# Patient Record
Sex: Female | Born: 1960
Health system: Southern US, Community
[De-identification: ages and names within clinical notes are randomized; demographics above are authoritative.]

## PROBLEM LIST (undated history)

## (undated) DIAGNOSIS — C801 Malignant (primary) neoplasm, unspecified: Secondary | ICD-10-CM

## (undated) DIAGNOSIS — F32A Depression, unspecified: Secondary | ICD-10-CM

## (undated) DIAGNOSIS — R32 Unspecified urinary incontinence: Secondary | ICD-10-CM

## (undated) DIAGNOSIS — F419 Anxiety disorder, unspecified: Secondary | ICD-10-CM

## (undated) DIAGNOSIS — M1990 Unspecified osteoarthritis, unspecified site: Secondary | ICD-10-CM

## (undated) DIAGNOSIS — R011 Cardiac murmur, unspecified: Secondary | ICD-10-CM

## (undated) DIAGNOSIS — T8859XA Other complications of anesthesia, initial encounter: Secondary | ICD-10-CM

## (undated) DIAGNOSIS — D126 Benign neoplasm of colon, unspecified: Secondary | ICD-10-CM

## (undated) DIAGNOSIS — I10 Essential (primary) hypertension: Secondary | ICD-10-CM

## (undated) DIAGNOSIS — K219 Gastro-esophageal reflux disease without esophagitis: Secondary | ICD-10-CM

## (undated) DIAGNOSIS — L309 Dermatitis, unspecified: Secondary | ICD-10-CM

## (undated) DIAGNOSIS — D649 Anemia, unspecified: Secondary | ICD-10-CM

## (undated) DIAGNOSIS — D494 Neoplasm of unspecified behavior of bladder: Secondary | ICD-10-CM

## (undated) DIAGNOSIS — F329 Major depressive disorder, single episode, unspecified: Secondary | ICD-10-CM

## (undated) DIAGNOSIS — E785 Hyperlipidemia, unspecified: Secondary | ICD-10-CM

## (undated) DIAGNOSIS — T4145XA Adverse effect of unspecified anesthetic, initial encounter: Secondary | ICD-10-CM

## (undated) HISTORY — DX: Depression, unspecified: F32.A

## (undated) HISTORY — DX: Major depressive disorder, single episode, unspecified: F32.9

## (undated) HISTORY — DX: Unspecified urinary incontinence: R32

## (undated) HISTORY — DX: Benign neoplasm of colon, unspecified: D12.6

## (undated) HISTORY — DX: Anemia, unspecified: D64.9

## (undated) HISTORY — PX: TUBAL LIGATION: SHX77

## (undated) HISTORY — PX: WISDOM TOOTH EXTRACTION: SHX21

## (undated) HISTORY — DX: Hyperlipidemia, unspecified: E78.5

## (undated) HISTORY — DX: Cardiac murmur, unspecified: R01.1

## (undated) HISTORY — DX: Anxiety disorder, unspecified: F41.9

---
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
## 2000-07-03 ENCOUNTER — Emergency Department (HOSPITAL_COMMUNITY): Admission: EM | Admit: 2000-07-03 | Discharge: 2000-07-03 | Payer: Self-pay | Admitting: Emergency Medicine

## 2010-05-15 ENCOUNTER — Ambulatory Visit
Admission: RE | Admit: 2010-05-15 | Discharge: 2010-05-15 | Payer: Self-pay | Source: Home / Self Care | Attending: Family Medicine | Admitting: Family Medicine

## 2010-05-15 DIAGNOSIS — L851 Acquired keratosis [keratoderma] palmaris et plantaris: Secondary | ICD-10-CM | POA: Insufficient documentation

## 2010-05-15 DIAGNOSIS — N959 Unspecified menopausal and perimenopausal disorder: Secondary | ICD-10-CM | POA: Insufficient documentation

## 2010-05-15 DIAGNOSIS — N39498 Other specified urinary incontinence: Secondary | ICD-10-CM | POA: Insufficient documentation

## 2010-05-29 ENCOUNTER — Encounter (INDEPENDENT_AMBULATORY_CARE_PROVIDER_SITE_OTHER): Payer: Self-pay | Admitting: *Deleted

## 2010-06-07 ENCOUNTER — Other Ambulatory Visit: Payer: Self-pay | Admitting: Family Medicine

## 2010-06-07 DIAGNOSIS — Z1231 Encounter for screening mammogram for malignant neoplasm of breast: Secondary | ICD-10-CM

## 2010-06-07 DIAGNOSIS — Z1239 Encounter for other screening for malignant neoplasm of breast: Secondary | ICD-10-CM

## 2010-06-12 ENCOUNTER — Ambulatory Visit: Admit: 2010-06-12 | Payer: Self-pay | Admitting: Gastroenterology

## 2010-06-12 ENCOUNTER — Encounter (INDEPENDENT_AMBULATORY_CARE_PROVIDER_SITE_OTHER): Payer: Self-pay | Admitting: *Deleted

## 2010-06-13 NOTE — Letter (Signed)
Summary: Pre Visit Letter Revised  [facility]  [address] [place], [place] [address]   Phone: [phone]  Fax: [phone]        05/29/2010 MRN: [id number] [name] [address] [place], [place]  [address]             Procedure Date:  June 26, 2010   Welcome to the Gastroenterology Division at [facility].    You are scheduled to see a nurse for your pre-procedure visit on June 12, 2010 at 8:30am  on the 3rd floor at [facility], [address]  We ask that you try to arrive at our office 15 minutes prior to your appointment time to allow for check-in.  Please take a minute to review the attached form.  If you answer "Yes" to one or more of the questions on the first page, we ask that you call the person listed at your earliest opportunity.  If you answer "No" to all of the questions, please complete the rest of the form and bring it to your appointment.    Your nurse visit will consist of discussing your medical and surgical history, your immediate family medical history, and your medications.   If you are unable to list all of your medications on the form, please bring the medication bottles to your appointment and we will list them.  We will need to be aware of both prescribed and over the counter drugs.  We will need to know exact dosage information as well.    Please be prepared to read and sign documents such as consent forms, a financial agreement, and acknowledgement forms.  If necessary, and with your consent, a friend or relative is welcome to sit-in on the nurse visit with you.  Please bring your insurance card so that we may make a copy of it.  If your insurance requires a referral to see a specialist, please bring your referral form from your primary care physician.  No co-pay is required for this nurse visit.     If you cannot keep your appointment, please call [phone] to cancel or reschedule prior to your appointment date.   This allows [place] the opportunity to schedule an appointment for another patient in need of care.    Thank you for choosing [facility] for your medical needs.  We appreciate the opportunity to care for you.  Please visit [place] at our website  to learn more about our practice.  Sincerely, The Gastroenterology Division

## 2010-06-13 NOTE — Assessment & Plan Note (Signed)
Summary: new to estab/cbs   Vital Signs:  Patient profile:   50 year old female Height:      65 inches Weight:      149 pounds BMI:     24.88 Pulse rate:   64 / minute BP sitting:   112 / 68  (left arm)  Vitals Entered By: Doristine Devoid CMA (May 15, 2010 3:14 PM) CC: NEW EST- spot on L leg, bladder issue w/ coughing or sneezing,   History of Present Illness: 50 yo woman here today to establish care.  previous MD- none.  GYN- none.  perimenopausal- having irregular periods.  not having hot flashes.  did have a stretch of fatigue but this has improved.  family hx of cancer- mom w/ ovarian, colon and possibly breast cancer.  pt not UTD on mammogram, pap, and has never had colonoscopy.  spot on L leg- has been watching spot 'for a long time'.  pt feels it is now irregularly shaped.  hx of sun exposure  stress incontinence- sxs started 1 year ago.  occurs w/ coughing or sneezing.  not occuring w/ laughter or exercise.  doesn't do Kegel exercises.  Preventive Screening-Counseling & Management  Alcohol-Tobacco     Alcohol drinks/day: <1     Smoking Status: never      Sexual History:  currently monogamous.        Drug Use:  never.    Current Medications (verified): 1)  None  Allergies (verified): No Known Drug Allergies  Past History:  Past Medical History: Hear murmur  Past Surgical History: BTL  Family History: CAD-father HTN-father DM-mother STROKE-no COLON CA-mother BREAST CA-mother? OVARIAN CA-maternal grandmother  Social History: lives alone 2 children- son local, daughter in [place] works for [employer] in [employer] Status:  never Sexual History:  currently monogamous Drug Use:  never  Review of Systems General:  Denies chills, fatigue, fever, loss of appetite, malaise, sleep disorder, and sweats. Eyes:  Denies blurring and double vision. ENT:  Denies nasal congestion and postnasal drainage. CV:  Denies chest pain or  discomfort, fatigue, shortness of breath with exertion, swelling of feet, and swelling of hands. Resp:  Denies cough and shortness of breath. GI:  Denies abdominal pain, change in bowel habits, nausea, and vomiting. MS:  Denies joint pain. Derm:  Complains of lesion(s); denies changes in color of skin, dryness, and itching. Neuro:  Denies headaches. Psych:  Denies anxiety and depression.  Physical Exam  General:  Well-developed,well-nourished,in no acute distress; alert,appropriate and cooperative throughout examination Head:  Normocephalic and atraumatic without obvious abnormalities. No apparent alopecia or balding. Eyes:  PERRL, EOMI Neck:  No deformities, masses, or tenderness noted. Lungs:  Normal respiratory effort, chest expands symmetrically. Lungs are clear to auscultation, no crackles or wheezes. Heart:  Normal rate and regular rhythm. S1 and S2 normal without gallop, murmur, click, rub or other extra sounds. Abdomen:  soft, NT/ND, +BS Pulses:  +2 carotid, radial, DP Extremities:  no C/C/E Neurologic:  alert & oriented X3, cranial nerves II-XII intact, gait normal, and DTRs symmetrical and normal.   Skin:  keratosis on L leg Cervical Nodes:  No lymphadenopathy noted Psych:  pressured speech, somewhat difficult to follow thought process   Impression & Recommendations:  Problem # 1:  PERIMENOPAUSAL SYNDROME (ICD-627.9) Assessment New pt reports irregular periods but denies other sxs at this time- no mood swings, hot flashes, fatigue.  will follow.  Problem # 2:  FAMILY HISTORY OF MALIGNANT NEOPLASM OVARY (ICD-V16.41)  Assessment: New stressed importance of regular GYN screening and mammogram.  pt to schedule.  Problem # 3:  KERATOSIS (ICD-701.1) Assessment: New no need for derm referral at this time.  will follow.  Problem # 4:  STRESS INCONTINENCE (ICD-788.39) Assessment: New encouraged pt to start kegel exercises.  if sxs persist will refer to urology.  Pt expresses  understanding and is in agreement w/ this plan.  Other Orders: Gastroenterology Referral (GI) Radiology Referral (Radiology)  Patient Instructions: 1)  Please schedule your physical as soon as possible- do not eat before this appt 2)  Someone will call you with your mammogram and GI (colonoscopy) appt 3)  Start doing your Kegel exercises at least 3x/day to strengthen your bladder muscles 4)  Call with any questions or concerns 5)  Welcome!  We're glad to have you!   Orders Added: 1)  Gastroenterology Referral [GI] 2)  Radiology Referral [Radiology] 3)  New Patient Level III [phone]

## 2010-06-19 NOTE — Letter (Signed)
Summary: Pre Visit No Show Letter  [facility]  [address] [place], [place] [address]   Phone: [phone]  Fax: [phone]        June 12, 2010 MRN: [id number]    [name] [address] [place], [place]  [address]    Dear Ms. STROUD,   We have been unable to reach you by phone concerning the pre-procedure visit that you missed on Wednesday 06/12/2010. For this reason,your procedure scheduled on Friday 05/18/2010 has been cancelled. Our scheduling staff will gladly assist you with rescheduling your appointments at a more convenient time. Please call our office at [phone] between the hours of 8:00am and 5:00pm, press option #2 to reach an appointment scheduler. Please consider updating your contact numbers at this time so that we can reach you by phone in the future with schedule changes or results.    Thank you,    Ezra Sites RN [facility]

## 2010-06-26 ENCOUNTER — Other Ambulatory Visit: Payer: Self-pay | Admitting: Gastroenterology

## 2010-07-03 ENCOUNTER — Ambulatory Visit: Payer: BC Managed Care – PPO

## 2010-07-05 ENCOUNTER — Other Ambulatory Visit (HOSPITAL_COMMUNITY)
Admission: RE | Admit: 2010-07-05 | Discharge: 2010-07-05 | Disposition: A | Discharge status: Home / Self Care | Payer: BC Managed Care – PPO | Source: Ambulatory Visit | Attending: Family Medicine | Admitting: Family Medicine

## 2010-07-05 ENCOUNTER — Encounter: Payer: Self-pay | Admitting: Family Medicine

## 2010-07-05 ENCOUNTER — Other Ambulatory Visit: Payer: Self-pay | Admitting: Family Medicine

## 2010-07-05 ENCOUNTER — Encounter (INDEPENDENT_AMBULATORY_CARE_PROVIDER_SITE_OTHER): Payer: BC Managed Care – PPO | Admitting: Family Medicine

## 2010-07-05 DIAGNOSIS — F411 Generalized anxiety disorder: Secondary | ICD-10-CM

## 2010-07-05 DIAGNOSIS — Z01419 Encounter for gynecological examination (general) (routine) without abnormal findings: Secondary | ICD-10-CM | POA: Insufficient documentation

## 2010-07-05 DIAGNOSIS — Z0000 Encounter for general adult medical examination without abnormal findings: Secondary | ICD-10-CM

## 2010-07-05 DIAGNOSIS — F418 Other specified anxiety disorders: Secondary | ICD-10-CM | POA: Insufficient documentation

## 2010-07-09 ENCOUNTER — Other Ambulatory Visit: Payer: BC Managed Care – PPO

## 2010-07-16 ENCOUNTER — Encounter: Payer: Self-pay | Admitting: Family Medicine

## 2010-07-16 ENCOUNTER — Other Ambulatory Visit (INDEPENDENT_AMBULATORY_CARE_PROVIDER_SITE_OTHER): Payer: BC Managed Care – PPO

## 2010-07-16 ENCOUNTER — Encounter (INDEPENDENT_AMBULATORY_CARE_PROVIDER_SITE_OTHER): Payer: Self-pay | Admitting: *Deleted

## 2010-07-16 ENCOUNTER — Other Ambulatory Visit: Payer: Self-pay | Admitting: Family Medicine

## 2010-07-16 DIAGNOSIS — Z0000 Encounter for general adult medical examination without abnormal findings: Secondary | ICD-10-CM

## 2010-07-16 DIAGNOSIS — E785 Hyperlipidemia, unspecified: Secondary | ICD-10-CM

## 2010-07-16 LAB — LIPID PANEL
Cholesterol: 160 mg/dL (ref 0–200)
Total CHOL/HDL Ratio: 4
Triglycerides: 303 mg/dL — ABNORMAL HIGH (ref 0.0–149.0)

## 2010-07-16 LAB — HEPATIC FUNCTION PANEL
ALT: 21 U/L (ref 0–35)
AST: 23 U/L (ref 0–37)
Bilirubin, Direct: 0.1 mg/dL (ref 0.0–0.3)
Total Bilirubin: 0.1 mg/dL — ABNORMAL LOW (ref 0.3–1.2)
Total Protein: 6.2 g/dL (ref 6.0–8.3)

## 2010-07-16 LAB — CBC WITH DIFFERENTIAL/PLATELET
Basophils Relative: 0.9 % (ref 0.0–3.0)
HCT: 33.2 % — ABNORMAL LOW (ref 36.0–46.0)
Hemoglobin: 11 g/dL — ABNORMAL LOW (ref 12.0–15.0)
Lymphocytes Relative: 32.5 % (ref 12.0–46.0)
Lymphs Abs: 1.6 10*3/uL (ref 0.7–4.0)
Monocytes Relative: 10.9 % (ref 3.0–12.0)
Neutro Abs: 2.3 10*3/uL (ref 1.4–7.7)
RBC: 4.11 Mil/uL (ref 3.87–5.11)

## 2010-07-16 LAB — BASIC METABOLIC PANEL
BUN: 13 mg/dL (ref 6–23)
CO2: 25 mEq/L (ref 19–32)
Chloride: 110 mEq/L (ref 96–112)
Creatinine, Ser: 0.7 mg/dL (ref 0.4–1.2)
Potassium: 4.3 mEq/L (ref 3.5–5.1)

## 2010-07-16 LAB — TSH: TSH: 1.13 u[IU]/mL (ref 0.35–5.50)
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
## 2010-07-22 ENCOUNTER — Telehealth: Payer: Self-pay | Admitting: *Deleted

## 2010-07-27 ENCOUNTER — Encounter: Payer: Self-pay | Admitting: Family Medicine

## 2010-07-30 NOTE — Progress Notes (Signed)
Summary: rx & other questions     Phone Note Call from Patient Call back at Home Phone [phone]   Caller: Patient Summary of Call: patient received lab result trigs were high - patient is to start fenofibrate - she didnt receive rx nor is it added to med list - [facility] [place]    - she also has question about bladder leakage that she mentioned in visit - has question about spot on leg  Initial call taken by: Okey Regal [place],  July 22, 2010 5:08 PM  Follow-up for Phone Call        Left message on machine to call back to office. Lucious Groves CMA  July 23, 2010 8:47 AM   No return call, left message to call back to office. Lucious Groves CMA  July 24, 2010 4:12 PM   Patient notified. She is worried about the spot on her leg and would like referral to dermatology. She would like to know what to do about bladder leakage? She also notes toe pain and is worried she may have gout. Please advise.  Lucious Groves CMA  July 25, 2010 4:37 PM   Additional Follow-up for Phone Call Additional follow up Details #1::        can have referral to dermatology although w/ her insurance she doesn't need one and is able to call whomever she would like.  if she doesn't have anyone in mind we will refer.  in regads to toe pain and bladder leakage- those require a visit and cannot be addressed over the phone Additional Follow-up by: Neena Rhymes MD,  July 26, 2010 8:31 AM    Additional Follow-up for Phone Call Additional follow up Details #2::    Left message on machine to call back to office. Lucious Groves CMA  July 26, 2010 10:24 AM   Additional Follow-up for Phone Call Additional follow up Details #3:: Details for Additional Follow-up Action Taken: Discuss with patient, appt scheduled....Marland KitchenMarland KitchenFelecia Deloach CMA  July 26, 2010 5:10 PM   New/Updated Medications: FENOFIBRATE 160 MG TABS (FENOFIBRATE) 1 by mouth once daily Prescriptions: FENOFIBRATE 160 MG TABS (FENOFIBRATE) 1 by mouth once  daily  #30 x 3   Entered by:   Lucious Groves CMA   Authorized by:   Neena Rhymes MD   Signed by:   Lucious Groves CMA on 07/23/2010   Method used:   Electronically to        [facility]* (retail)       [address]       [place], [place]  [address]       Ph: [phone]       Fax: [phone]   RxID:   [id number]

## 2010-08-08 ENCOUNTER — Ambulatory Visit: Payer: BC Managed Care – PPO | Admitting: Family Medicine

## 2010-08-12 ENCOUNTER — Ambulatory Visit: Payer: BC Managed Care – PPO | Admitting: Family Medicine

## 2010-10-29 ENCOUNTER — Other Ambulatory Visit: Payer: Self-pay | Admitting: Family Medicine

## 2010-10-30 MED ORDER — ALPRAZOLAM 0.25 MG PO TABS: 0.2500 mg | ORAL_TABLET | Freq: Every evening | ORAL | Status: DC | PRN

## 2010-10-30 NOTE — Telephone Encounter (Signed)
Left message on voicemail to call the office

## 2010-10-30 NOTE — Telephone Encounter (Signed)
Ok for #30.  Please remind her that she needs to return for LFTs since she started Fenofibrate.

## 2010-10-30 NOTE — Telephone Encounter (Signed)
Please advise, Also, fyi, pt never had lft's checked after starting Fenofibrate.

## 2010-10-31 NOTE — Telephone Encounter (Signed)
Received vm from the patient stating that she did pick up the med but she is fine when sleeping. Pt notes that she needs something for day. Do you want her taking the Xanax at night or day? If so, how many times per day? Left message on voicemail to call the office.

## 2010-10-31 NOTE — Telephone Encounter (Signed)
Left message on voicemail to call the office

## 2010-10-31 NOTE — Telephone Encounter (Signed)
Pt notified. Pt also notes that she has not been on the Fenofibrate and that is why she did not come back for the labs. Pt would like to take medication for her Triglycerides, but notes that she does not want to take Fenofibrate due to the side effects. She states that she does not want to take a medication that is going to make her worse. I advised her that Fenofibrate is the generic med that specifically targets triglycerides. Pt notes that maybe she will have to try a more expensive med that doesn't have as many side effects. She notes that she did not understand her labs. I advised pt that she needs ov to discuss the questions she has about her labs and to discuss medication options. Pt expressed understanding and states that she has "too much on me right now" and cannot come in for ov due to co-pay. I advised the patient that I would let the doctor know that she will call back for ov when she can afford it.

## 2010-10-31 NOTE — Telephone Encounter (Signed)
If she cannot afford the OV, a more expensive med seems like a bad idea.  Even the brand name triglyceride meds are fenofibrate and would have the same potential side effects.  It is more risky to have thick, fatty blood than to take the medicine.  Will need to follow up when she is able b/c it sounds as if we have a lot to discuss.

## 2010-10-31 NOTE — Telephone Encounter (Signed)
She can take meds during the day but it will make her sleepy

## 2010-10-31 NOTE — Telephone Encounter (Signed)
Left 2nd message on fathers number for pt to call the office. Still unable to reach the pt at her number.

## 2010-11-01 NOTE — Telephone Encounter (Signed)
Left message on voicemail to call the office

## 2010-11-04 NOTE — Telephone Encounter (Signed)
Left message on voicemail to call the office

## 2010-11-05 NOTE — Telephone Encounter (Signed)
No return call from pt, sent letter.

## 2011-06-10 ENCOUNTER — Ambulatory Visit: Payer: BC Managed Care – PPO

## 2011-06-30 ENCOUNTER — Ambulatory Visit
Admission: RE | Admit: 2011-06-30 | Discharge: 2011-06-30 | Disposition: A | Discharge status: Home / Self Care | Payer: BC Managed Care – PPO | Source: Ambulatory Visit | Attending: Family Medicine | Admitting: Family Medicine

## 2011-06-30 DIAGNOSIS — Z1231 Encounter for screening mammogram for malignant neoplasm of breast: Secondary | ICD-10-CM

## 2011-07-03 ENCOUNTER — Other Ambulatory Visit: Payer: Self-pay | Admitting: Family Medicine

## 2011-07-03 DIAGNOSIS — R928 Other abnormal and inconclusive findings on diagnostic imaging of breast: Secondary | ICD-10-CM

## 2011-07-23 ENCOUNTER — Other Ambulatory Visit: Payer: BC Managed Care – PPO

## 2011-08-08 ENCOUNTER — Other Ambulatory Visit: Payer: Self-pay | Admitting: Family Medicine

## 2011-08-08 ENCOUNTER — Ambulatory Visit
Admission: RE | Admit: 2011-08-08 | Discharge: 2011-08-08 | Disposition: A | Discharge status: Home / Self Care | Payer: BC Managed Care – PPO | Source: Ambulatory Visit | Attending: Family Medicine | Admitting: Family Medicine

## 2011-08-08 DIAGNOSIS — R928 Other abnormal and inconclusive findings on diagnostic imaging of breast: Secondary | ICD-10-CM

## 2011-08-11 ENCOUNTER — Inpatient Hospital Stay: Admission: RE | Admit: 2011-08-11 | Payer: BC Managed Care – PPO | Source: Ambulatory Visit

## 2011-08-26 ENCOUNTER — Telehealth: Payer: Self-pay | Admitting: Family Medicine

## 2011-08-26 NOTE — Telephone Encounter (Signed)
Caller: [name]/Patient; PCP: Sheliah Hatch.; CB#: [phone];  Call regarding Leaking Bladder; Onset approx. 1 year, seems to be getting worse - occurs with coughing or sneezing only.  Occasional "bladder irritation"  lasting for a few minutes.  Emergent sx ruled out.  Home care for the interim and parameters for callback given.  See Provider within 2 Weeks per Urinary Sx. protocol.  Caller states plan to call for appt at a later time.

## 2011-08-27 NOTE — Telephone Encounter (Signed)
Agree w/ appt- not urgent

## 2011-09-01 NOTE — Telephone Encounter (Signed)
Called pt to get an update per did not note an apt made, left vm to see if pt sxs were better or if she still needs to schedule apt

## 2011-10-03 ENCOUNTER — Encounter: Payer: Self-pay | Admitting: Internal Medicine

## 2011-10-28 ENCOUNTER — Ambulatory Visit (AMBULATORY_SURGERY_CENTER): Payer: BC Managed Care – PPO

## 2011-10-28 VITALS — Ht 64.5 in | Wt 141.9 lb

## 2011-10-28 DIAGNOSIS — Z1211 Encounter for screening for malignant neoplasm of colon: Secondary | ICD-10-CM

## 2011-10-28 NOTE — Progress Notes (Signed)
Pt came into office for her pre-visit prior to her colonoscopy with Dr.  Rhea Belton on 11/11/11. Pt is requesting an endoscopy to be done with the  colonoscopy due to reflux. She will discuss this with Dr Beverely Low and have their office call back to reschedule an endo/colon.Her appt on 11/11/11 for her colonoscopy was cancelled. Pt understood. Ulis Rias RN

## 2011-10-29 ENCOUNTER — Telehealth: Payer: Self-pay | Admitting: *Deleted

## 2011-10-29 NOTE — Telephone Encounter (Signed)
Pt called to advise that during her GI apt with MD Pyrtle office she was advised that MD Beverely Low will need to order the upper endoscopy for her, advised pt that MD Tabori protocol is to send the pt for the GI referral and GI MD will advise if she needs a upper endoscopy along with the colonoscopy that MD Tabori sent the pt to GI for originally, called Pyrtle nurse leslie and advised pt testing request, was advised the pt came in for a procedure discussion visit with the nurse yesterday per notes MD Pyrtle is out of the office all week, pt did have the procedure scheduled for July 2nd and leslie notes the nurse that spoke with the pt during the OV cancelled the procedure for the pt yesterday, leslie noted she will contact the nurse from the OV yesterday and get an update as to why the pt cancelled and what was advised to the pt, gave personal extension

## 2011-10-30 NOTE — Telephone Encounter (Signed)
Verlon Au called back to advise that after research this pt was indeed advised to call MD Tabori about adding a EGD with the colonoscopy, however this is not protocol that she was told this, she notes the CMA that told the pt this is not in the office today and the pt may have to come back in to discuss directly with MD Pyrtle, leslie will call back again tomorrow to update, advised pt that the GI office will contact her about the EGD, pt stated that she told the nurse that she wanted to ask MD Tabori about ordering the test, advised pt that the GI MD will need to order her tests per a specialist, pt asked about coming into the office per notes still bleeding a small amount after her period stopped a week ago, advised she can come in for an OV however MD Beverely Low advises that this can be common for her age and may continue throughout the menopausel period, however no way to note when this period will be over, pt advised she can also monitor her sxs and see if they improve, noted last PAP on 07-05-10 pt does not have a GYN, pt decided she will just see if her sxs improve, pt understood all instructions

## 2011-10-31 ENCOUNTER — Telehealth: Payer: Self-pay | Admitting: Internal Medicine

## 2011-10-31 NOTE — Telephone Encounter (Signed)
LVM for pt to call me back regarding colon and possible endo.

## 2011-10-31 NOTE — Telephone Encounter (Signed)
Ulis Rias, RN 10/28/2011 5:29 PM Signed  Pt came into office for her pre-visit prior to her colonoscopy with Dr. Rhea Belton on 11/11/11. Pt is requesting an endoscopy to be done with the colonoscopy due to reflux. She will discuss this with Dr Beverely Low and have their office call back to reschedule an endo/colon.Her appt on 11/11/11 for her colonoscopy was cancelled. Pt understood. Ulis Rias RN   Would like pt to come in and speak to you about possible endo/colon.  Please advise.

## 2011-10-31 NOTE — Telephone Encounter (Signed)
Not sure if you want pt to come in and discuss Endo.  Please advise.  Ulis Rias, RN 10/28/2011 5:29 PM Signed  Pt came into office for her pre-visit prior to her colonoscopy with Dr. Rhea Belton on 11/11/11. Pt is requesting an endoscopy to be done with the colonoscopy due to reflux. She will discuss this with Dr Beverely Low and have their office call back to reschedule an endo/colon.Her appt on 11/11/11 for her colonoscopy was cancelled. Pt understood. Ulis Rias RN

## 2011-10-31 NOTE — Telephone Encounter (Deleted)
Patient scheduled for next week to discuss.

## 2011-11-03 ENCOUNTER — Telehealth: Payer: Self-pay | Admitting: Family Medicine

## 2011-11-03 NOTE — Telephone Encounter (Signed)
Called pt to advise that we still have not heard from the GI office, pt stated she was returning my call from Friday, noted in chart the pt actually received a call from GI and not this office, advised pt to call the GI MD about the call, pt understood

## 2011-11-05 ENCOUNTER — Telehealth: Payer: Self-pay | Admitting: Gastroenterology

## 2011-11-05 NOTE — Telephone Encounter (Signed)
lvm for pt to call back to discuss Colon and endo

## 2011-11-07 NOTE — Telephone Encounter (Signed)
Pt called this office to return our call advised that we did not call her Gastro office called about her apt for endo, pt stated she will call them to clarify, pt noted confused

## 2011-11-11 ENCOUNTER — Encounter: Payer: BC Managed Care – PPO | Admitting: Internal Medicine

## 2011-11-26 ENCOUNTER — Telehealth: Payer: Self-pay | Admitting: *Deleted

## 2011-11-26 NOTE — Telephone Encounter (Signed)
Noted pt called in to speak with scheduler about apt for anxiety, pt advised that she needs to check with her supervisor the openings MD Beverely Low has currently and plans to call back to clarify apt, this nurse spoke to pt to get an update about her apt with MD Pyrtle with GI, pt advised she was told that the GI MD Pyrtle is out on vacation and that his office is to call her back but they have not reached out to her, pt then noted she is not pressing the issue per really does not want to take anymore time off from work until sept, advised pt that the GI MD will be the MD that will decide if she needs a Endo to accompany the Colonoscopy that MD Tabori referred her too, pt understood and will call our office back about apt needs as well as MD pyrtle office

## 2011-12-05 ENCOUNTER — Telehealth: Payer: Self-pay | Admitting: Internal Medicine

## 2012-01-09 ENCOUNTER — Telehealth: Payer: Self-pay | Admitting: *Deleted

## 2012-01-09 ENCOUNTER — Other Ambulatory Visit: Payer: Self-pay | Admitting: Family Medicine

## 2012-01-09 DIAGNOSIS — N649 Disorder of breast, unspecified: Secondary | ICD-10-CM

## 2012-01-09 NOTE — Telephone Encounter (Signed)
lvm got pt to call me back regarding her procedure SHE cancelled because she wanted to talk to her Dr. About getting a colon and endo instead of talking to Dr. Rhea Belton about it.

## 2012-01-13 ENCOUNTER — Telehealth: Payer: Self-pay | Admitting: Internal Medicine

## 2012-01-14 NOTE — Telephone Encounter (Signed)
Spoke to pt. She needs instructions for her procedure. She said the nurse at her other pre-visit never gave her any and she cant keep taking off work to come in.  I told her to come in Monday and I will go over instructions with her.

## 2012-01-19 ENCOUNTER — Other Ambulatory Visit: Payer: Self-pay | Admitting: Gastroenterology

## 2012-01-26 ENCOUNTER — Telehealth: Payer: Self-pay | Admitting: Gastroenterology

## 2012-01-26 NOTE — Telephone Encounter (Signed)
Tried calling patient to see when she will be in to get her instructions for her procedure on 9/23... Only phone number we have for her has been disconnected.

## 2012-01-28 ENCOUNTER — Other Ambulatory Visit: Payer: Self-pay | Admitting: Gastroenterology

## 2012-01-28 ENCOUNTER — Telehealth: Payer: Self-pay | Admitting: Gastroenterology

## 2012-01-28 MED ORDER — PEG-KCL-NACL-NASULF-NA ASC-C 100 G PO SOLR: 1.0000 | Freq: Once | ORAL | Status: DC

## 2012-01-28 NOTE — Telephone Encounter (Signed)
Called pt's father because because her number has been disconnected. He gave me her work number where I left a voicemail asking her to call me back ASAP regarding her instructions for her colonoscopy on Monday. Her father assured me he will have her up here today to get instructions

## 2012-01-28 NOTE — Telephone Encounter (Signed)
Pt called me back saying she was in yesterday to get her instructions, she said she asked for me but was taken into a room where someone else gave her the instructions.  there is nothing in her chart that proves this.  She said she has already "started her diet" and just needs her prep sent to the pharmacy. She said she knew when to start her prep etc.

## 2012-02-02 ENCOUNTER — Encounter: Payer: Self-pay | Admitting: Internal Medicine

## 2012-02-02 ENCOUNTER — Encounter: Payer: BC Managed Care – PPO | Admitting: Internal Medicine

## 2012-02-02 ENCOUNTER — Ambulatory Visit (AMBULATORY_SURGERY_CENTER): Payer: BC Managed Care – PPO | Admitting: Internal Medicine

## 2012-02-02 VITALS — BP 107/72 | HR 75 | Temp 96.9°F | Resp 19 | Ht 64.5 in | Wt 141.0 lb

## 2012-02-02 DIAGNOSIS — D126 Benign neoplasm of colon, unspecified: Secondary | ICD-10-CM

## 2012-02-02 DIAGNOSIS — Z1211 Encounter for screening for malignant neoplasm of colon: Secondary | ICD-10-CM

## 2012-02-02 DIAGNOSIS — K219 Gastro-esophageal reflux disease without esophagitis: Secondary | ICD-10-CM

## 2012-02-02 DIAGNOSIS — D129 Benign neoplasm of anus and anal canal: Secondary | ICD-10-CM

## 2012-02-02 DIAGNOSIS — D128 Benign neoplasm of rectum: Secondary | ICD-10-CM

## 2012-02-02 MED ORDER — SODIUM CHLORIDE 0.9 % IV SOLN: 500.0000 mL | INTRAVENOUS | Status: DC

## 2012-02-02 NOTE — Op Note (Signed)
[facility] [address] [place], [address]   COLONOSCOPY PROCEDURE REPORT  PATIENT: Ariana, [name]  MR#: [id number] BIRTHDATE: 1960/08/26 , 51  yrs. old GENDER: Female ENDOSCOPIST: Beverley Fiedler, MD REFERRED BY: PROCEDURE DATE:  02/02/2012 PROCEDURE:   Colonoscopy with snare polypectomy and Colonoscopy with cold biopsy polypectomy ASA CLASS:   Class I INDICATIONS:elevated risk screening, patient's immediate family history of colon cancer, and first colonoscopy. MEDICATIONS: MAC sedation, administered by CRNA and Propofol (Diprivan) 130 mg IV  DESCRIPTION OF PROCEDURE:   After the risks benefits and alternatives of the procedure were thoroughly explained, informed consent was obtained.  A digital rectal exam revealed no rectal mass.   The LB CF-H180AL [id number]  endoscope was introduced through the anus and advanced to the terminal ileum which was intubated for a short distance. No adverse events experienced.   The quality of the prep was good, using MoviPrep  The instrument was then slowly withdrawn as the colon was fully examined.   COLON FINDINGS: The mucosa appeared normal in the terminal ileum. A sessile polyp measuring 4 mm in size was found in the descending colon.  A polypectomy was performed with a cold snare.  The resection was complete and the polyp tissue was completely retrieved.   A sessile polyp measuring 2 mm in size was found in rectum seen upon the retroflexed view.  A polypectomy was performed with cold forceps.  The resection was complete and the polyp tissue was completely retrieved.  Retroflexed views revealed no abnormalities. The time to cecum=3 minutes 59 seconds.  Withdrawal time=14 minutes 57 seconds.  The scope was withdrawn and the procedure completed. COMPLICATIONS: There were no complications.  ENDOSCOPIC IMPRESSION: 1.   Normal mucosa in the terminal ileum 2.   Sessile polyp measuring 4 mm in size was found in the descending  colon; polypectomy was performed with a cold snare 3.   Sessile polyp measuring 2 mm in size was found in rectum seen upon the retroflexed view; polypectomy was performed with cold forceps  RECOMMENDATIONS: 1.  Await pathology results 2.  If the polyps removed today are proven to be adenomatous (pre-cancerous) polyps, you will need a repeat colonoscopy in 5 years.   Given your significant family history of colon cancer, you should have a repeat colonoscopy in 5 years.  You will receive a letter within 1-2 weeks with the results of your biopsy as well as final recommendations.  Please call my office if you have not received a letter after 3 weeks.  eSigned:  Beverley Fiedler, MD 02/02/2012 11:00 AM  cc: Sheliah Hatch, MD and The Patient   PATIENT NAME:  Ariana, [name] MR#: [id number]

## 2012-02-02 NOTE — Progress Notes (Signed)
The schedule said the pt had hx of colon ca.  The pt denied the hx.  She said she was here for a screening colonoscopy and there was not a family hx of colon cancer either.  The pt also said there was a lot of confusion with setting up the colonoscopy.  Per the pt she said she come for a pre-visit and the nurse there said she needed a referral from her medical doctor.  The pt said she did not need a referral but there was confusion about getting colonoscopy scheduled. The pt was not set back up for pre-visit apponit and she came to office and spoke with a CMA to get "brief instructions" per the pt.  I appoligized for the confusion and told her we would take very good care of her today.  Pt seemed to be okay with this. Maw

## 2012-02-02 NOTE — Op Note (Signed)
[name] [address] [place], [address]   ENDOSCOPY PROCEDURE REPORT  PATIENT: [name], [name]  MR#: [id number] BIRTHDATE: Mar 03, 1961 , 51  yrs. old GENDER: Female ENDOSCOPIST: Beverley Fiedler, MD REFERRED BY:  Neena Rhymes PROCEDURE DATE:  02/02/2012 PROCEDURE:  EGD, diagnostic ASA CLASS:     Class I INDICATIONS:  history of GERD. MEDICATIONS: MAC sedation, administered by CRNA and Propofol (Diprivan) 200 mg IV TOPICAL ANESTHETIC: Cetacaine Spray  DESCRIPTION OF PROCEDURE: After the risks benefits and alternatives of the procedure were thoroughly explained, informed consent was obtained.  The LB GIF-H180 [id number] endoscope was introduced through the mouth and advanced to the second portion of the duodenum. Without limitations.  The instrument was slowly withdrawn as the mucosa was fully examined.    The upper, middle and distal third of the esophagus were carefully inspected and no abnormalities were noted.  The z-line was well seen at the GEJ.  The endoscope was pushed into the fundus which was normal including a retroflexed view.  The antrum, gastric body, first and second part of the duodenum were unremarkable. Retroflexed views revealed no abnormalities.     The scope was then withdrawn from the patient and the procedure completed.  COMPLICATIONS: There were no complications.  ENDOSCOPIC IMPRESSION: Normal EGD  RECOMMENDATIONS: 1.  Continue current medications 2.  Can treat symptomatic heartburn/reflux with over the counter famotidine 10 mg every 12 hours as needed.  eSigned:  Beverley Fiedler, MD 02/02/2012 10:54 AM   XB:[id number], Natalia Leatherwood MD and The Patient

## 2012-02-02 NOTE — Patient Instructions (Addendum)
Findings:  Colon Polyps, EGD Normal  Recommendations:  Continue current medications,  Can treat Heartburn/reflux with famotidine (pepcid) 10 mg every 12 hours as needed.  This medication is over the counter.  YOU HAD AN ENDOSCOPIC PROCEDURE TODAY AT THE [facility]: Refer to the procedure report that was given to you for any specific questions about what was found during the examination.  If the procedure report does not answer your questions, please call your gastroenterologist to clarify.  If you requested that your care partner not be given the details of your procedure findings, then the procedure report has been included in a sealed envelope for you to review at your convenience later.  YOU SHOULD EXPECT: Some feelings of bloating in the abdomen. Passage of more gas than usual.  Walking can help get rid of the air that was put into your GI tract during the procedure and reduce the bloating. If you had a lower endoscopy (such as a colonoscopy or flexible sigmoidoscopy) you may notice spotting of blood in your stool or on the toilet paper. If you underwent a bowel prep for your procedure, then you may not have a normal bowel movement for a few days.  DIET: Your first meal following the procedure should be a light meal and then it is ok to progress to your normal diet.  A half-sandwich or bowl of soup is an example of a good first meal.  Heavy or fried foods are harder to digest and may make you feel nauseous or bloated.  Likewise meals heavy in dairy and vegetables can cause extra gas to form and this can also increase the bloating.  Drink plenty of fluids but you should avoid alcoholic beverages for 24 hours.  ACTIVITY: Your care partner should take you home directly after the procedure.  You should plan to take it easy, moving slowly for the rest of the day.  You can resume normal activity the day after the procedure however you should NOT DRIVE or use heavy machinery for 24 hours  (because of the sedation medicines used during the test).    SYMPTOMS TO REPORT IMMEDIATELY: A gastroenterologist can be reached at any hour.  During normal business hours, 8:30 AM to 5:00 PM Monday through Friday, call [phone].  After hours and on weekends, please call the GI answering service at [phone] who will take a message and have the physician on call contact you.   Following lower endoscopy (colonoscopy or flexible sigmoidoscopy):  Excessive amounts of blood in the stool  Significant tenderness or worsening of abdominal pains  Swelling of the abdomen that is new, acute  Fever of 100F or higher  Following upper endoscopy (EGD)  Vomiting of blood or coffee ground material  New chest pain or pain under the shoulder blades  Painful or persistently difficult swallowing  New shortness of breath  Fever of 100F or higher  Black, tarry-looking stools  FOLLOW UP: If any biopsies were taken you will be contacted by phone or by letter within the next 1-3 weeks.  Call your gastroenterologist if you have not heard about the biopsies in 3 weeks.  Our staff will call the home number listed on your records the next business day following your procedure to check on you and address any questions or concerns that you may have at that time regarding the information given to you following your procedure. This is a courtesy call and so if there is no answer at the home  number and we have not heard from you through the emergency physician on call, we will assume that you have returned to your regular daily activities without incident.  SIGNATURES/CONFIDENTIALITY: You and/or your care partner have signed paperwork which will be entered into your electronic medical record.  These signatures attest to the fact that that the information above on your After Visit Summary has been reviewed and is understood.  Full responsibility of the confidentiality of this discharge information lies with you  and/or your care-partner.   Please follow all discharge instructions given to you by the recovery room nurse. If you have any questions or problems after discharge please call one of the numbers listed above. You will receive a phone call in the am to see how you are doing and answer any questions you may have. Thank you for choosing [facility] for your health care needs.

## 2012-02-02 NOTE — Progress Notes (Signed)
Patient did not experience any of the following events: a burn prior to discharge; a fall within the facility; wrong site/side/patient/procedure/implant event; or a hospital transfer or hospital admission upon discharge from the facility. (G8907) Patient did not have preoperative order for IV antibiotic SSI prophylaxis. (G8918)  

## 2012-02-03 ENCOUNTER — Telehealth: Payer: Self-pay

## 2012-02-03 NOTE — Telephone Encounter (Signed)
Left a message on the pt's answering machine to call us back if any questions or concerns. Maw  

## 2012-02-06 ENCOUNTER — Telehealth: Payer: Self-pay | Admitting: Internal Medicine

## 2012-02-06 ENCOUNTER — Ambulatory Visit
Admission: RE | Admit: 2012-02-06 | Discharge: 2012-02-06 | Disposition: A | Discharge status: Home / Self Care | Payer: BC Managed Care – PPO | Source: Ambulatory Visit | Attending: Family Medicine | Admitting: Family Medicine

## 2012-02-06 DIAGNOSIS — N649 Disorder of breast, unspecified: Secondary | ICD-10-CM

## 2012-02-06 MED ORDER — FAMOTIDINE 20 MG PO TABS: 20.0000 mg | ORAL_TABLET | Freq: Two times a day (BID) | ORAL | Status: DC | PRN

## 2012-02-06 NOTE — Telephone Encounter (Signed)
Prescription place for famotidine Please notify patient

## 2012-02-09 ENCOUNTER — Encounter: Payer: Self-pay | Admitting: Internal Medicine

## 2012-02-26 NOTE — Telephone Encounter (Signed)
cma opened new encounter message answered

## 2012-04-07 ENCOUNTER — Telehealth: Payer: Self-pay | Admitting: Internal Medicine

## 2012-04-12 NOTE — Telephone Encounter (Signed)
lmom for pt to call back

## 2012-04-13 NOTE — Telephone Encounter (Signed)
Pt never called back.

## 2012-04-26 ENCOUNTER — Encounter: Payer: Self-pay | Admitting: Lab

## 2012-04-26 ENCOUNTER — Ambulatory Visit (INDEPENDENT_AMBULATORY_CARE_PROVIDER_SITE_OTHER): Payer: BC Managed Care – PPO | Admitting: Family Medicine

## 2012-04-26 ENCOUNTER — Encounter: Payer: Self-pay | Admitting: *Deleted

## 2012-04-26 ENCOUNTER — Encounter: Payer: Self-pay | Admitting: Family Medicine

## 2012-04-26 VITALS — BP 110/72 | HR 56 | Temp 98.1°F

## 2012-04-26 DIAGNOSIS — J309 Allergic rhinitis, unspecified: Secondary | ICD-10-CM

## 2012-04-26 DIAGNOSIS — Z23 Encounter for immunization: Secondary | ICD-10-CM

## 2012-04-26 DIAGNOSIS — Z8679 Personal history of other diseases of the circulatory system: Secondary | ICD-10-CM

## 2012-04-26 DIAGNOSIS — Z0000 Encounter for general adult medical examination without abnormal findings: Secondary | ICD-10-CM

## 2012-04-26 DIAGNOSIS — N39498 Other specified urinary incontinence: Secondary | ICD-10-CM

## 2012-04-26 DIAGNOSIS — F411 Generalized anxiety disorder: Secondary | ICD-10-CM

## 2012-04-26 MED ORDER — ESCITALOPRAM OXALATE 10 MG PO TABS: 10.0000 mg | ORAL_TABLET | Freq: Every day | ORAL | Status: DC

## 2012-04-26 NOTE — Progress Notes (Signed)
  Subjective:    Patient ID: [name], female    DOB: 03-Sep-1960, 51 y.o.   MRN: [id number]  HPI CPE- UTD on colonoscopy, mammo.      Review of Systems Patient reports no vision/ hearing changes, adenopathy,fever, weight change,  persistant/recurrent hoarseness , swallowing issues, chest pain, palpitations, edema, persistant/recurrent cough, hemoptysis, dyspnea (rest/exertional/paroxysmal nocturnal), gastrointestinal bleeding (melena, rectal bleeding), abdominal pain, significant heartburn, bowel changes, Gyn symptoms (abnormal  bleeding, pain),  syncope, focal weakness, memory loss, numbness & tingling, skin/hair/nail changes, abnormal bruising or bleeding.  + fatigue + seasonal/intermittent allergy- would like testing + stress incontinence- wearing pad daily due to inability to control bladder w/ cough, sneeze.  No loss of control w/ laughter, exercise. + depression- mom w/ 'manic depression', will hyperventilate w/ stress.    Objective:   Physical Exam General Appearance:    Alert, cooperative, no distress, appears stated age  Head:    Normocephalic, without obvious abnormality, atraumatic  Eyes:    PERRL, conjunctiva/corneas clear, EOM's intact, fundi    benign, both eyes  Ears:    Normal TM's and external ear canals, both ears  Nose:   Nares normal, septum midline, mucosa normal, no drainage    or sinus tenderness  Throat:   Lips, mucosa, and tongue normal; teeth and gums normal  Neck:   Supple, symmetrical, trachea midline, no adenopathy;    Thyroid: no enlargement/tenderness/nodules  Back:     Symmetric, no curvature, ROM normal, no CVA tenderness  Lungs:     Clear to auscultation bilaterally, respirations unlabored  Chest Wall:    No tenderness or deformity   Heart:    Regular rate and rhythm, S1 and S2 normal, II/VI SEM, no rub   or gallop  Breast Exam:    Deferred to GYN  Abdomen:     Soft, non-tender, bowel sounds active all four quadrants,    no masses, no  organomegaly  Genitalia:    Deferred to GYN  Rectal:    Extremities:   Extremities normal, atraumatic, no cyanosis or edema  Pulses:   2+ and symmetric all extremities  Skin:   Skin color, texture, turgor normal, no rashes or lesions  Lymph nodes:   Cervical, supraclavicular, and axillary nodes normal  Neurologic:   CNII-XII intact, normal strength, sensation and reflexes    throughout          Assessment & Plan:

## 2012-04-26 NOTE — Patient Instructions (Addendum)
Follow up in 1 month to recheck mood Start the Lexapro We'll notify you of your lab results and make any changes if needed Someone will call you with your cardiology and urology appts Call with any questions or concerns Hang in there! Happy Holidays!

## 2012-04-27 LAB — BASIC METABOLIC PANEL
CO2: 23 mEq/L (ref 19–32)
Chloride: 104 mEq/L (ref 96–112)
Potassium: 4 mEq/L (ref 3.5–5.1)
Sodium: 135 mEq/L (ref 135–145)

## 2012-04-27 LAB — ALLERGY FULL PROFILE
Alternaria Alternata: <0.1 kU/L
Bahia Grass: <0.1 kU/L
Bermuda Grass: <0.1 kU/L
Box Elder IgE: <0.1 kU/L
Candida Albicans: <0.1 kU/L
Cat Dander: <0.1 kU/L
Curvularia lunata: <0.1 kU/L
Dog Dander: <0.1 kU/L
Fescue: <0.1 kU/L
G005 Rye, Perennial: <0.1 kU/L
G009 Red Top: <0.1 kU/L
Goldenrod: <0.1 kU/L
Helminthosporium halodes: <0.1 kU/L
Lamb's Quarters: <0.1 kU/L

## 2012-04-27 LAB — LIPID PANEL
Cholesterol: 175 mg/dL (ref 0–200)
LDL Cholesterol: 106 mg/dL — ABNORMAL HIGH (ref 0–99)
Total CHOL/HDL Ratio: 4
VLDL: 20.6 mg/dL (ref 0.0–40.0)

## 2012-04-27 LAB — CBC WITH DIFFERENTIAL/PLATELET
Basophils Absolute: 0.1 10*3/uL (ref 0.0–0.1)
Eosinophils Absolute: 0.2 10*3/uL (ref 0.0–0.7)
Hemoglobin: 10.6 g/dL — ABNORMAL LOW (ref 12.0–15.0)
Lymphocytes Relative: 33.1 % (ref 12.0–46.0)
MCHC: 31.9 g/dL (ref 30.0–36.0)
Monocytes Relative: 10.5 % (ref 3.0–12.0)
Neutro Abs: 2.2 10*3/uL (ref 1.4–7.7)
Neutrophils Relative %: 50.5 % (ref 43.0–77.0)
Platelets: 300 10*3/uL (ref 150.0–400.0)
RDW: 16 % — ABNORMAL HIGH (ref 11.5–14.6)

## 2012-04-27 LAB — HEPATIC FUNCTION PANEL
ALT: 33 U/L (ref 0–35)
Alkaline Phosphatase: 60 U/L (ref 39–117)
Bilirubin, Direct: 0.1 mg/dL (ref 0.0–0.3)
Total Bilirubin: 0.4 mg/dL (ref 0.3–1.2)
Total Protein: 7 g/dL (ref 6.0–8.3)

## 2012-04-30 LAB — VITAMIN D 1,25 DIHYDROXY
Vitamin D2 1, 25 (OH)2: <8 pg/mL
Vitamin D3 1, 25 (OH)2: 44 pg/mL

## 2012-05-03 ENCOUNTER — Encounter: Payer: Self-pay | Admitting: *Deleted

## 2012-05-03 ENCOUNTER — Ambulatory Visit: Payer: BC Managed Care – PPO | Admitting: Cardiology

## 2012-05-06 ENCOUNTER — Telehealth: Payer: Self-pay | Admitting: Family Medicine

## 2012-05-06 NOTE — Telephone Encounter (Signed)
Pt called and has many questions she would like you to call her.

## 2012-05-07 ENCOUNTER — Telehealth: Payer: Self-pay | Admitting: *Deleted

## 2012-05-07 NOTE — Telephone Encounter (Signed)
Would you call pt regarding many questions.  Thank you!

## 2012-05-07 NOTE — Telephone Encounter (Signed)
Patient had questions concerning bloodwork and medication prescribed over 1 year ago that she never filled. Advised patient that Rx is expired and that she needed to discuss the need for triglyceride medication with Dr. Beverely Low Hilma Favors level 2012-- 315 currently 103). Encouraged patient to watch diet and add regular exercise to help lower total Chol and trigl levels. Patient also had questions concerning Lexapro, wanted to know how long it would take to see change in mood. She states she is beginning to feel better. Advised total effects may take up to 4-6 weeks so just continue current dose under the supervision of Dr. Beverely Low and keep f/u appt as scheduled.

## 2012-05-09 NOTE — Assessment & Plan Note (Signed)
New.  Pt would like cards referral to determine etiology.

## 2012-05-09 NOTE — Assessment & Plan Note (Signed)
New.  Pt would like testing to determine allergic triggers- lab work ordered.

## 2012-05-09 NOTE — Assessment & Plan Note (Signed)
New.  Pt extremely anxious today.  + family hx.  Reviewed sxs of mania- pt denies.  Will start low dose SSRI and monitor closely for both improvement and mania.  Reviewed supportive care and red flags that should prompt return.  Pt expressed understanding and is in agreement w/ plan.

## 2012-05-09 NOTE — Assessment & Plan Note (Signed)
New.  Refer to urology for complete evaluation and tx.

## 2012-05-09 NOTE — Assessment & Plan Note (Signed)
Pt's PE WNL w/ the exception of marked anxiety.  UTD on GYN.  Check labs.  Anticipatory guidance provided.

## 2012-06-03 ENCOUNTER — Ambulatory Visit (INDEPENDENT_AMBULATORY_CARE_PROVIDER_SITE_OTHER): Payer: BC Managed Care – PPO | Admitting: Family Medicine

## 2012-06-03 ENCOUNTER — Encounter: Payer: Self-pay | Admitting: Family Medicine

## 2012-06-03 ENCOUNTER — Other Ambulatory Visit (HOSPITAL_COMMUNITY)
Admission: RE | Admit: 2012-06-03 | Discharge: 2012-06-03 | Disposition: A | Discharge status: Home / Self Care | Payer: BC Managed Care – PPO | Source: Ambulatory Visit | Attending: Family Medicine | Admitting: Family Medicine

## 2012-06-03 VITALS — BP 100/62 | HR 73 | Wt 153.0 lb

## 2012-06-03 DIAGNOSIS — Z01419 Encounter for gynecological examination (general) (routine) without abnormal findings: Secondary | ICD-10-CM | POA: Insufficient documentation

## 2012-06-03 DIAGNOSIS — Z124 Encounter for screening for malignant neoplasm of cervix: Secondary | ICD-10-CM | POA: Insufficient documentation

## 2012-06-03 NOTE — Progress Notes (Signed)
  Subjective:    Patient ID: [name], female    DOB: Aug 25, 1960, 52 y.o.   MRN: [id number]  HPI Pap- pt had CPE done in December.  Due for pap.  Pt due for mammo the end of March- plans to schedule.  Declines breast exam today.  No vaginal pain, discharge, abnormal bleeding, concerns for STDs.  Still menstruating.  Denies breast pain, lumps, skin changes, discharge.   Review of Systems For ROS see HPI     Objective:   Physical Exam  Vitals reviewed. Constitutional: She appears well-developed and well-nourished. No distress.  Genitourinary: Rectal exam shows no external hemorrhoid and no fissure. There is no rash, tenderness or lesion on the right labia. There is no rash, tenderness or lesion on the left labia. Uterus is not deviated, not enlarged, not fixed and not tender. Cervix exhibits no motion tenderness, no discharge and no friability. Right adnexum displays no mass, no tenderness and no fullness. Left adnexum displays no mass, no tenderness and no fullness. No erythema, tenderness or bleeding around the vagina. No vaginal discharge found.          Assessment & Plan:

## 2012-06-03 NOTE — Patient Instructions (Addendum)
Follow up in 6 months to recheck triglycerides We'll notify you of your pap results Call with any questions or concerns Happy New Year!

## 2012-06-03 NOTE — Assessment & Plan Note (Signed)
Normal GYN exam.  Pap collected

## 2012-06-08 ENCOUNTER — Encounter: Payer: Self-pay | Admitting: *Deleted

## 2012-06-14 ENCOUNTER — Ambulatory Visit: Payer: BC Managed Care – PPO | Admitting: Cardiology

## 2012-06-14 ENCOUNTER — Other Ambulatory Visit: Payer: Self-pay | Admitting: Family Medicine

## 2012-06-14 DIAGNOSIS — N6320 Unspecified lump in the left breast, unspecified quadrant: Secondary | ICD-10-CM

## 2012-07-09 ENCOUNTER — Ambulatory Visit
Admission: RE | Admit: 2012-07-09 | Discharge: 2012-07-09 | Disposition: A | Discharge status: Home / Self Care | Payer: BC Managed Care – PPO | Source: Ambulatory Visit | Attending: Family Medicine | Admitting: Family Medicine

## 2012-07-09 DIAGNOSIS — N6320 Unspecified lump in the left breast, unspecified quadrant: Secondary | ICD-10-CM

## 2013-01-28 ENCOUNTER — Encounter: Payer: Self-pay | Admitting: Family Medicine

## 2013-01-28 ENCOUNTER — Ambulatory Visit (INDEPENDENT_AMBULATORY_CARE_PROVIDER_SITE_OTHER): Payer: BC Managed Care – PPO | Admitting: Family Medicine

## 2013-01-28 VITALS — BP 118/80 | HR 78 | Temp 98.0°F | Wt 149.8 lb

## 2013-01-28 DIAGNOSIS — N924 Excessive bleeding in the premenopausal period: Secondary | ICD-10-CM

## 2013-01-28 NOTE — Progress Notes (Signed)
  Subjective:    Patient ID: [name], female    DOB: [birth year], 52 y.o.   MRN: [id number]  HPI Menorrhagia- pt reports she has been bleeding x14 days.  Pt reports she has been passing marble sized clots x3 days but today 'it's tapering off'.  + fatigue.  + hot flashes.  Reports periods have been regular.   Review of Systems For ROS see HPI     Objective:   Physical Exam  Vitals reviewed. Constitutional: She is oriented to person, place, and time. She appears well-developed and well-nourished. No distress.  HENT:  Head: Normocephalic and atraumatic.  Eyes: EOM are normal. Pupils are equal, round, and reactive to light.  Pale conjunctiva  Neck: Normal range of motion. Neck supple. No thyromegaly present.  Cardiovascular: Normal rate, regular rhythm, normal heart sounds and intact distal pulses.   Pulmonary/Chest: Effort normal and breath sounds normal. No respiratory distress. She has no wheezes. She has no rales.  Lymphadenopathy:    She has no cervical adenopathy.  Neurological: She is alert and oriented to person, place, and time.  Skin: Skin is warm and dry.  Psychiatric: She has a normal mood and affect. Her behavior is normal.          Assessment & Plan:

## 2013-01-28 NOTE — Patient Instructions (Addendum)
If you continue to have heavy bleeding- please call me! We'll notify you of your lab results and make any changes if needed Call with any questions or concerns Hang in there!!

## 2013-01-29 LAB — CBC WITH DIFFERENTIAL/PLATELET
Eosinophils Absolute: 0.2 10*3/uL (ref 0.0–0.7)
Hemoglobin: 8.8 g/dL — ABNORMAL LOW (ref 12.0–15.0)
Lymphocytes Relative: 30 % (ref 12–46)
Lymphs Abs: 1.7 10*3/uL (ref 0.7–4.0)
Monocytes Relative: 10 % (ref 3–12)
Neutro Abs: 3.1 10*3/uL (ref 1.7–7.7)
Neutrophils Relative %: 55 % (ref 43–77)
Platelets: 435 10*3/uL — ABNORMAL HIGH (ref 150–400)
RBC: 4.55 MIL/uL (ref 3.87–5.11)
WBC: 5.7 10*3/uL (ref 4.0–10.5)

## 2013-01-31 MED ORDER — FERROUS SULFATE 325 (65 FE) MG PO TABS: 325.0000 mg | ORAL_TABLET | Freq: Every day | ORAL | Status: DC

## 2013-01-31 NOTE — Assessment & Plan Note (Signed)
New.  Reviewed benign nature of process but cautioned against possible anemia w/ prolonged bleeding.  Check CBC- start iron supplement prn.  If heavy, irregular bleeding continues, will need GYN referral.  Pt expressed understanding and is in agreement w/ plan.

## 2013-02-22 ENCOUNTER — Ambulatory Visit (INDEPENDENT_AMBULATORY_CARE_PROVIDER_SITE_OTHER): Payer: BC Managed Care – PPO | Admitting: Nurse Practitioner

## 2013-02-22 ENCOUNTER — Ambulatory Visit (INDEPENDENT_AMBULATORY_CARE_PROVIDER_SITE_OTHER)
Admission: RE | Admit: 2013-02-22 | Discharge: 2013-02-22 | Disposition: A | Discharge status: Home / Self Care | Payer: BC Managed Care – PPO | Source: Ambulatory Visit | Attending: Nurse Practitioner | Admitting: Nurse Practitioner

## 2013-02-22 ENCOUNTER — Encounter: Payer: Self-pay | Admitting: Family Medicine

## 2013-02-22 ENCOUNTER — Telehealth: Payer: Self-pay | Admitting: *Deleted

## 2013-02-22 ENCOUNTER — Encounter: Payer: Self-pay | Admitting: *Deleted

## 2013-02-22 ENCOUNTER — Encounter: Payer: Self-pay | Admitting: Nurse Practitioner

## 2013-02-22 ENCOUNTER — Telehealth: Payer: Self-pay | Admitting: Nurse Practitioner

## 2013-02-22 ENCOUNTER — Encounter (INDEPENDENT_AMBULATORY_CARE_PROVIDER_SITE_OTHER): Payer: BC Managed Care – PPO | Admitting: Nurse Practitioner

## 2013-02-22 VITALS — BP 110/80 | HR 62 | Temp 97.9°F | Ht 64.5 in | Wt 146.2 lb

## 2013-02-22 DIAGNOSIS — R42 Dizziness and giddiness: Secondary | ICD-10-CM

## 2013-02-22 DIAGNOSIS — R05 Cough: Secondary | ICD-10-CM

## 2013-02-22 DIAGNOSIS — R059 Cough, unspecified: Secondary | ICD-10-CM

## 2013-02-22 DIAGNOSIS — F341 Dysthymic disorder: Secondary | ICD-10-CM

## 2013-02-22 DIAGNOSIS — D649 Anemia, unspecified: Secondary | ICD-10-CM

## 2013-02-22 MED ORDER — FERROUS SULFATE 325 (65 FE) MG PO TABS: 325.0000 mg | ORAL_TABLET | Freq: Every day | ORAL | Status: DC

## 2013-02-22 NOTE — Telephone Encounter (Signed)
nml CXR. Cough may be r/t post-nasal drip, allergies, asthma?

## 2013-02-22 NOTE — Progress Notes (Signed)
Subjective:     [name] is a 52 y.o. female who presents for evaluation of dizziness. The symptoms started several months ago, stopped and started again today. The attacks are intermittent and last only a few seconds.  Positions that worsen symptoms: none. Previous workup/treatments: none. Although she was found to have worsened anemia a few weeks ago after heavy menstrual bleeding. Additionally, she reports chronic congested cough for 3 years. Associated ear symptoms: none. Associated CNS symptoms: none. Recent infections: none. Head trauma: denied. Drug ingestion: none. Noise exposure: no occupational exposure. Family history: non-contributory.  The following portions of the patient's history were reviewed and updated as appropriate: allergies, current medications, past family history, past medical history, past social history, past surgical history and problem list.  Review of Systems Constitutional: negative for fatigue Eyes: negative for visual disturbance Respiratory: positive for cough, negative for asthma, pleurisy/chest pain, sputum and wheezing Cardiovascular: negative for chest pain, chest pressure/discomfort, exertional chest pressure/discomfort, irregular heart beat, lower extremity edema and near-syncope Gastrointestinal: negative Neurological: positive for dizziness, negative for coordination problems, headaches, memory problems, paresthesia, seizures, tremors and weakness Allergic/Immunologic: negative    Objective:    LMP 01/14/2013 General appearance: alert, cooperative, appears stated age and no distress Head: Normocephalic, without obvious abnormality, atraumatic Eyes: negative findings: lids and lashes normal, conjunctivae and sclerae normal and corneas clear, negative Dix-Hallpike maneuver Ears: normal TM's and external ear canals both ears and retracted bilateral TMs pale conjunctiva Throat: lips, mucosa, and tongue normal; teeth and gums normal Pale lips, lower lip  appears to have splotchy pigment loss. Neck: no adenopathy, no carotid bruit, supple, symmetrical, trachea midline and thyroid feels full on L side neck Lungs: clear to auscultation bilaterally Heart: regular, bradycardic Extremities: extremities normal, atraumatic, no cyanosis or edema Pulses: 2+ and symmetric Lymph nodes: Cervical, supraclavicular, and axillary nodes normal. Neurologic: Alert and oriented X 3, normal strength and tone. Normal symmetric reflexes. Normal coordination and gait      Assessment:    Dizziness. Etiology unknown. Negative Dix-halpike Maneuver, nml ECG, no middle ear effusion. r/t anemia? hormonal changes as pt is likely perimenopausal?   Cough times 3 years Plan:    Labs as ordered. Ecg done in ofc.  Chest xray.
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
## 2013-02-22 NOTE — Telephone Encounter (Signed)
Spoke with pt after she called the office with complaints of dizzy spells. Patient states she has not passed out and in able to drive herself back and forth to work and on errands. Chart reviewed by provider and it was noted that pts Hgb was 8.8 at last visit 3 weeks ago. Patient encouraged to go the ER for evaluation but she refused. Appt made with Maximino Sarin, NP at the [facility] office at 1:30.

## 2013-02-22 NOTE — Patient Instructions (Addendum)
Our office will call you with lab & chest xray results and any necessary follow up. Continue to take iron supplements as I think your dizziness may be related to anemia. Continue to exercise. Please let Dr Beverely Low know if you begin to feel depressed again or if dizzy episodes become worse or do not improve. Nice to meet you!  Anemia, Frequently Asked Questions WHAT ARE THE SYMPTOMS OF ANEMIA?  Headache.  Difficulty thinking.  Fatigue.  Shortness of breath.  Weakness.  Rapid heartbeat. AT WHAT POINT ARE PEOPLE CONSIDERED ANEMIC?  This varies with gender and age.   Both hemoglobin (Hgb) and hematocrit values are used to define anemia. These lab values are obtained from a complete blood count (CBC) test. This is performed at a caregiver's office.  The normal range of hemoglobin values for adult men is 14.0 g/dL to 95.6 g/dL. For nonpregnant women, values are 12.3 g/dL to 21.3 g/dL.  The World Health Organization defines anemia as less than 12 g/dL for nonpregnant women and less than 13 g/dL for men.  For adult males, the average normal hematocrit is 46%, and the range is 40% to 52%.  For adult females, the average normal hematocrit is 41%, and the range is 35% to 47%.  Values that fall below the lower limits can be a sign of anemia and should have further checking (evaluation). GROUPS OF PEOPLE WHO ARE AT RISK FOR DEVELOPING ANEMIA INCLUDE:   Infants who are breastfed or taking a formula that is not fortified with iron.  Children going through a rapid growth spurt. The iron available can not keep up with the needs for a red cell mass which must grow with the child.  Women in childbearing years. They need iron because of blood loss during menstruation.  Pregnant women. The growing fetus creates a high demand for iron.  People with ongoing gastrointestinal blood loss are at risk of developing iron deficiency.  Individuals with leukemia or cancer who must receive chemotherapy or  radiation to treat their disease. The drugs or radiation used to treat these diseases often decreases the bone marrow's ability to make cells of all classes. This includes red blood cells, white blood cells, and platelets.  Individuals with chronic inflammatory conditions such as rheumatoid arthritis or chronic infections.  The elderly. ARE SOME TYPES OF ANEMIA INHERITED?   Yes, some types of anemia are due to inherited or genetic defects.  Sickle cell anemia. This occurs most often in people of African, African American, and Mediterranean descent.  Thalassemia (or Cooley's anemia). This type is found in people of Mediterranean and Southeast Asian descent. These types of anemia are common.  [place]i. This is rare. CAN CERTAIN MEDICATIONS CAUSE A PERSON TO BECOME ANEMIC?  Yes. For example, drugs to fight cancer (chemotherapeutic agents) often cause anemia. These drugs can slow the bone marrow's ability to make red blood cells. If there are not enough red blood cells, the body does not get enough oxygen. WHAT HEMATOCRIT LEVEL IS REQUIRED TO DONATE BLOOD?  The lower limit of an acceptable hematocrit for blood donors is 38%. If you have a low hematocrit value, you should schedule an appointment with your caregiver. ARE BLOOD TRANSFUSIONS COMMONLY USED TO CORRECT ANEMIA, AND ARE THEY DANGEROUS?  They are used to treat anemia as a last resort. Your caregiver will find the cause of the anemia and correct it if possible. Most blood transfusions are given because of excessive bleeding at the time of surgery, with trauma, or  because of bone marrow suppression in patients with cancer or leukemia on chemotherapy. Blood transfusions are safer than ever before. We also know that blood transfusions affect the immune system and may increase certain risks. There is also a concern for human error. In 1/16,000 transfusions, a patient receives a transfusion of blood that is not matched with his or her blood type.   WHAT IS IRON DEFICIENCY ANEMIA AND CAN I CORRECT IT BY CHANGING MY DIET?  Iron is an essential part of hemoglobin. Without enough hemoglobin, anemia develops and the body does not get the right amount of oxygen. Iron deficiency anemia develops after the body has had a low level of iron for a long time. This is either caused by blood loss, not taking in or absorbing enough iron, or increased demands for iron (like pregnancy or rapid growth).  Foods from animal origin such as beef, chicken, and pork, are good sources of iron. Be sure to have one of these foods at each meal. Vitamin C helps your body absorb iron. Foods rich in Vitamin C include citrus, bell pepper, strawberries, spinach and cantaloupe. In some cases, iron supplements may be needed in order to correct the iron deficiency. In the case of poor absorption, extra iron may have to be given directly into the vein through a needle (intravenously). I HAVE BEEN DIAGNOSED WITH IRON DEFICIENCY ANEMIA AND MY CAREGIVER PRESCRIBED IRON SUPPLEMENTS. HOW LONG WILL IT TAKE FOR MY BLOOD TO BECOME NORMAL?  It depends on the degree of anemia at the beginning of treatment. Most people with mild to moderate iron deficiency, anemia will correct the anemia over a period of 2 to 3 months. But after the anemia is corrected, the iron stored by the body is still low. Caregivers often suggest an additional 6 months of oral iron therapy once the anemia has been reversed. This will help prevent the iron deficiency anemia from quickly happening again. Non-anemic adult males should take iron supplements only under the direction of a doctor, too much iron can cause liver damage.  MY HEMOGLOBIN IS 9 G/DL AND I AM SCHEDULED FOR SURGERY. SHOULD I POSTPONE THE SURGERY?  If you have Hgb of 9, you should discuss this with your caregiver right away. Many patients with similar hemoglobin levels have had surgery without problems. If minimal blood loss is expected for a minor procedure,  no treatment may be necessary.  If a greater blood loss is expected for more extensive procedures, you should ask your caregiver about being treated with erythropoietin and iron. This is to accelerate the recovery of your hemoglobin to a normal level before surgery. An anemic patient who undergoes high-blood-loss surgery has a greater risk of surgical complications and need for a blood transfusion, which also carries some risk.  I HAVE BEEN TOLD THAT HEAVY MENSTRUAL PERIODS CAUSE ANEMIA. IS THERE ANYTHING I CAN DO TO PREVENT THE ANEMIA?  Anemia that results from heavy periods is usually due to iron deficiency. You can try to meet the increased demands for iron caused by the heavy monthly blood loss by increasing the intake of iron-rich foods. Iron supplements may be required. Discuss your concerns with your caregiver. WHAT CAUSES ANEMIA DURING PREGNANCY?  Pregnancy places major demands on the body. The mother must meet the needs of both her body and her growing baby. The body needs enough iron and folate to make the right amount of red blood cells. To prevent anemia while pregnant, the mother should stay in close contact  with her caregiver.  Be sure to eat a diet that has foods rich in iron and folate like liver and dark green leafy vegetables. Folate plays an important role in the normal development of a baby's spinal cord. Folate can help prevent serious disorders like spina bifida. If your diet does not provide adequate nutrients, you may want to talk with your caregiver about nutritional supplements.  WHAT IS THE RELATIONSHIP BETWEEN FIBROID TUMORS AND ANEMIA IN WOMEN?  The relationship is usually caused by the increased menstrual blood loss caused by fibroids. Good iron intake may be required to prevent iron deficiency anemia from developing.  Document Released: 12/05/2003 Document Revised: 07/21/2011 Document Reviewed: 05/21/2010 [facility] Patient Information 2014 [place], [place].

## 2013-02-23 ENCOUNTER — Telehealth: Payer: Self-pay | Admitting: Nurse Practitioner

## 2013-02-23 LAB — CBC
HCT: 35.3 % — ABNORMAL LOW (ref 36.0–46.0)
MCH: 21.2 pg — ABNORMAL LOW (ref 26.0–34.0)
Platelets: 372 10*3/uL (ref 150–400)
RDW: 24.8 % — ABNORMAL HIGH (ref 11.5–15.5)
WBC: 6 10*3/uL (ref 4.0–10.5)

## 2013-02-23 LAB — T4, FREE: Free T4: 1.12 ng/dL (ref 0.80–1.80)

## 2013-02-23 LAB — TSH: TSH: 1.683 u[IU]/mL (ref 0.350–4.500)

## 2013-02-23 LAB — FERRITIN: Ferritin: 11 ng/mL (ref 10–291)

## 2013-02-23 LAB — IRON AND TIBC
%SAT: 26 % (ref 20–55)
UIBC: 276 ug/dL (ref 125–400)

## 2013-02-23 NOTE — Telephone Encounter (Signed)
LMOVM for patient to return call concerning results.  

## 2013-02-23 NOTE — Telephone Encounter (Signed)
Hgb improved, still low. Iron sat & ferritin are low nml. Cont. W/iron supplement.  No thyroid disease, but did appreciate L sided thyroid fullness on exam yesterday. Still waiting on B12 (methylmalonic acid). Pt has had chronic anemia-not sure cause.

## 2013-02-24 ENCOUNTER — Telehealth: Payer: Self-pay | Admitting: *Deleted

## 2013-02-24 NOTE — Telephone Encounter (Signed)
Patient was notified of results.  

## 2013-02-24 NOTE — Telephone Encounter (Signed)
Patient was wondering what dose vitamin D she needs to be taking. Patient also wanted trig and bad ldl numbers again. Please advise?

## 2013-02-25 ENCOUNTER — Telehealth: Payer: Self-pay | Admitting: Nurse Practitioner

## 2013-02-25 NOTE — Telephone Encounter (Signed)
See phone note

## 2013-02-28 NOTE — Telephone Encounter (Signed)
Patient aware.

## 2013-03-07 NOTE — Progress Notes (Signed)
This encounter was created in error - please disregard.

## 2013-03-17 ENCOUNTER — Ambulatory Visit (INDEPENDENT_AMBULATORY_CARE_PROVIDER_SITE_OTHER): Payer: BC Managed Care – PPO | Admitting: Family Medicine

## 2013-03-17 ENCOUNTER — Encounter: Payer: Self-pay | Admitting: Family Medicine

## 2013-03-17 VITALS — BP 120/70 | HR 96 | Temp 98.1°F | Resp 16 | Wt 146.5 lb

## 2013-03-17 DIAGNOSIS — D500 Iron deficiency anemia secondary to blood loss (chronic): Secondary | ICD-10-CM

## 2013-03-17 NOTE — Patient Instructions (Signed)
Schedule your complete physical at your convenience We'll notify you of your blood work and make any changes if needed Call with any questions or concerns Keep up the good work!  You look great! Happy Holidays!!!

## 2013-03-17 NOTE — Assessment & Plan Note (Signed)
Improving w/ continued iron supplementation.  Currently asymptomatic.  Repeat CBC.

## 2013-03-17 NOTE — Progress Notes (Signed)
  Subjective:    Patient ID: [name], female    DOB: 20-Jan-1961, 52 y.o.   MRN: [id number]  HPI Anemia- this is due to blood loss from heavy periods.  Had very heavy and long cycle in September, normal cycle in October, waiting for cycle to start for this month.  Having sxs- breast soreness, fatigue, food cravings.  Is currently on iron.  Denies current dizziness, fatigue, palpitations.   Review of Systems For ROS see HPI     Objective:   Physical Exam  Vitals reviewed. Constitutional: She appears well-developed and well-nourished. No distress.  HENT:  Head: Normocephalic and atraumatic.  Eyes: Conjunctivae and EOM are normal. Pupils are equal, round, and reactive to light.  Neck: Normal range of motion. Neck supple.  Cardiovascular: Normal rate, regular rhythm, normal heart sounds and intact distal pulses.   Pulmonary/Chest: Effort normal and breath sounds normal. No respiratory distress. She has no wheezes. She has no rales.          Assessment & Plan:

## 2013-03-18 ENCOUNTER — Encounter: Payer: Self-pay | Admitting: General Practice

## 2013-03-18 LAB — CBC WITH DIFFERENTIAL/PLATELET
Basophils Relative: 1.1 % (ref 0.0–3.0)
Eosinophils Absolute: 0.3 10*3/uL (ref 0.0–0.7)
HCT: 38 % (ref 36.0–46.0)
Hemoglobin: 12.4 g/dL (ref 12.0–15.0)
Lymphs Abs: 1.7 10*3/uL (ref 0.7–4.0)
MCHC: 32.6 g/dL (ref 30.0–36.0)
MCV: 77.2 fl — ABNORMAL LOW (ref 78.0–100.0)
Monocytes Absolute: 0.7 10*3/uL (ref 0.1–1.0)
Neutro Abs: 3.4 10*3/uL (ref 1.4–7.7)
RBC: 4.93 Mil/uL (ref 3.87–5.11)

## 2013-05-24 ENCOUNTER — Telehealth: Payer: Self-pay

## 2013-05-24 NOTE — Telephone Encounter (Signed)
Left message for call back. Non-identifiable  PAP-06/03/12-normal CCS-02/01/13-adenomatous polyps, to repeat in 5 years MMg-07/09/12 Flu Tdap- 04/26/12

## 2013-05-24 NOTE — Telephone Encounter (Signed)
Medication and allergies:  Reviewed and updated  90 day supply/mail order: n/a Local pharmacy:  [facility] [address]   Immunizations due: None, Declined Flu vaccine.   A/P: No changes to personal, family history or past surgical hx PAP-06/03/12-normal CCS-02/01/13-adenomatous polyps MMG-07/09/12 probable benign findings Flu- Declined Tdap-04/26/12   To Discuss with Provider: Feels depressed and fatigued.  Wants to talk to you about hyperventilation.

## 2013-05-26 ENCOUNTER — Encounter: Payer: Self-pay | Admitting: General Practice

## 2013-05-26 ENCOUNTER — Ambulatory Visit (INDEPENDENT_AMBULATORY_CARE_PROVIDER_SITE_OTHER): Payer: BC Managed Care – PPO | Admitting: Family Medicine

## 2013-05-26 ENCOUNTER — Encounter: Payer: Self-pay | Admitting: Family Medicine

## 2013-05-26 VITALS — BP 120/80 | HR 59 | Temp 98.2°F | Resp 16 | Ht 65.0 in | Wt 154.1 lb

## 2013-05-26 DIAGNOSIS — Z0000 Encounter for general adult medical examination without abnormal findings: Secondary | ICD-10-CM

## 2013-05-26 DIAGNOSIS — F341 Dysthymic disorder: Secondary | ICD-10-CM

## 2013-05-26 DIAGNOSIS — F418 Other specified anxiety disorders: Secondary | ICD-10-CM

## 2013-05-26 LAB — LIPID PANEL
CHOL/HDL RATIO: 3
CHOLESTEROL: 175 mg/dL (ref 0–200)
HDL: 59.6 mg/dL (ref >39.00)
LDL CALC: 98 mg/dL (ref 0–99)
Triglycerides: 85 mg/dL (ref 0.0–149.0)
VLDL: 17 mg/dL (ref 0.0–40.0)

## 2013-05-26 LAB — BASIC METABOLIC PANEL
BUN: 14 mg/dL (ref 6–23)
CALCIUM: 8.6 mg/dL (ref 8.4–10.5)
CO2: 22 mEq/L (ref 19–32)
Chloride: 109 mEq/L (ref 96–112)
Creatinine, Ser: 0.7 mg/dL (ref 0.4–1.2)
GFR: 88.01 mL/min (ref >60.00)
GLUCOSE: 90 mg/dL (ref 70–99)
Potassium: 4.4 mEq/L (ref 3.5–5.1)
SODIUM: 137 meq/L (ref 135–145)

## 2013-05-26 LAB — CBC WITH DIFFERENTIAL/PLATELET
BASOS PCT: 0.4 % (ref 0.0–3.0)
Basophils Absolute: 0 10*3/uL (ref 0.0–0.1)
EOS PCT: 7.4 % — AB (ref 0.0–5.0)
Eosinophils Absolute: 0.4 10*3/uL (ref 0.0–0.7)
HEMATOCRIT: 35.7 % — AB (ref 36.0–46.0)
Hemoglobin: 11.6 g/dL — ABNORMAL LOW (ref 12.0–15.0)
LYMPHS ABS: 1.3 10*3/uL (ref 0.7–4.0)
Lymphocytes Relative: 24.4 % (ref 12.0–46.0)
MCHC: 32.5 g/dL (ref 30.0–36.0)
MCV: 80.4 fl (ref 78.0–100.0)
Monocytes Absolute: 0.6 10*3/uL (ref 0.1–1.0)
Monocytes Relative: 11.5 % (ref 3.0–12.0)
Neutro Abs: 3 10*3/uL (ref 1.4–7.7)
Neutrophils Relative %: 56.3 % (ref 43.0–77.0)
Platelets: 290 10*3/uL (ref 150.0–400.0)
RBC: 4.44 Mil/uL (ref 3.87–5.11)
RDW: 15.9 % — ABNORMAL HIGH (ref 11.5–14.6)
WBC: 5.3 10*3/uL (ref 4.5–10.5)

## 2013-05-26 LAB — HEPATIC FUNCTION PANEL
ALBUMIN: 3.9 g/dL (ref 3.5–5.2)
ALT: 14 U/L (ref 0–35)
AST: 15 U/L (ref 0–37)
Alkaline Phosphatase: 61 U/L (ref 39–117)
Bilirubin, Direct: 0 mg/dL (ref 0.0–0.3)
TOTAL PROTEIN: 6.8 g/dL (ref 6.0–8.3)
Total Bilirubin: 0.4 mg/dL (ref 0.3–1.2)

## 2013-05-26 LAB — TSH: TSH: 1 u[IU]/mL (ref 0.35–5.50)

## 2013-05-26 MED ORDER — ALPRAZOLAM 0.25 MG PO TABS: 0.2500 mg | ORAL_TABLET | Freq: Two times a day (BID) | ORAL | Status: DC | PRN

## 2013-05-26 MED ORDER — FLUOXETINE HCL 20 MG PO TABS: 20.0000 mg | ORAL_TABLET | Freq: Every day | ORAL | Status: DC

## 2013-05-26 NOTE — Progress Notes (Signed)
Pre visit review using our clinic review tool, if applicable. No additional management support is needed unless otherwise documented below in the visit note. 

## 2013-05-26 NOTE — Progress Notes (Signed)
   Subjective:    Patient ID: [name], female    DOB: 08-06-1960, 53 y.o.   MRN: [id number]  HPI CPE- UTD on pap, mammo, colonoscopy.  Depression/anxiety- chronic problem, 'i'm suffering from loneliness'  Recently got out of 13 yr relationship.  Increased sleep, has lost interest in things she likes to do.  Work is stressful, has become more emotional.  Looking for rescue medication.  Mom is 'manic depressive'.  sxs tend to worsen w/ menstrual cycle.   Review of Systems Patient reports no vision/ hearing changes, adenopathy,fever, weight change,  persistant/recurrent hoarseness , swallowing issues, chest pain, palpitations, edema, persistant/recurrent cough, hemoptysis, dyspnea (rest/exertional/paroxysmal nocturnal), gastrointestinal bleeding (melena, rectal bleeding), abdominal pain, significant heartburn, bowel changes, GU symptoms (dysuria, hematuria, incontinence), Gyn symptoms (abnormal  bleeding, pain),  syncope, focal weakness, memory loss, numbness & tingling, skin/hair/nail changes, abnormal bruising or bleeding.     Objective:   Physical Exam General Appearance:    Alert, cooperative, no distress, appears stated age  Head:    Normocephalic, without obvious abnormality, atraumatic  Eyes:    PERRL, conjunctiva/corneas clear, EOM's intact, fundi    benign, both eyes  Ears:    Normal TM's and external ear canals, both ears  Nose:   Nares normal, septum midline, mucosa normal, no drainage    or sinus tenderness  Throat:   Lips, mucosa, and tongue normal; teeth and gums normal  Neck:   Supple, symmetrical, trachea midline, no adenopathy;    Thyroid: no enlargement/tenderness/nodules  Back:     Symmetric, no curvature, ROM normal, no CVA tenderness  Lungs:     Clear to auscultation bilaterally, respirations unlabored  Chest Wall:    No tenderness or deformity   Heart:    Regular rate and rhythm, S1 and S2 normal, no murmur, rub   or gallop  Breast Exam:    Deferred to mammo    Abdomen:     Soft, non-tender, bowel sounds active all four quadrants,    no masses, no organomegaly  Genitalia:    Deferred  Rectal:    Extremities:   Extremities normal, atraumatic, no cyanosis or edema  Pulses:   2+ and symmetric all extremities  Skin:   Skin color, texture, turgor normal, no rashes or lesions  Lymph nodes:   Cervical, supraclavicular, and axillary nodes normal  Neurologic:   CNII-XII intact, normal strength, sensation and reflexes    throughout          Assessment & Plan:

## 2013-05-26 NOTE — Assessment & Plan Note (Signed)
Pt's PE WNL w/ exception of depression/anxiety.  UTD on pap, mammo, colonoscopy.  Check labs.  Anticipatory guidance provided.

## 2013-05-26 NOTE — Assessment & Plan Note (Signed)
Deteriorated.  Start low dose daily SSRI and add low dose xanax prn.  Will follow closely.

## 2013-05-26 NOTE — Patient Instructions (Signed)
Follow up in 4-6 weeks to recheck mood Start the Prozac daily for depression/anxiety Use the Alprazolam as needed for panicked moments We'll notify you of your lab results and make any changes if needed Call with any questions or concerns Hang in there!!!

## 2013-05-27 ENCOUNTER — Telehealth: Payer: Self-pay | Admitting: Family Medicine

## 2013-05-27 NOTE — Telephone Encounter (Signed)
Patient wants her preferred pharmacy changed to [facility] [address]. No call back requested.

## 2013-05-27 NOTE — Telephone Encounter (Signed)
Pharmacy updated.

## 2013-05-29 LAB — VITAMIN D 1,25 DIHYDROXY
VITAMIN D3 1, 25 (OH): 43 pg/mL
Vitamin D 1, 25 (OH)2 Total: 43 pg/mL (ref 18–72)
Vitamin D2 1, 25 (OH)2: <8 pg/mL

## 2013-06-08 ENCOUNTER — Other Ambulatory Visit: Payer: Self-pay | Admitting: Family Medicine

## 2013-06-08 DIAGNOSIS — N63 Unspecified lump in unspecified breast: Secondary | ICD-10-CM

## 2013-06-23 ENCOUNTER — Encounter: Payer: Self-pay | Admitting: *Deleted

## 2013-06-23 ENCOUNTER — Ambulatory Visit (INDEPENDENT_AMBULATORY_CARE_PROVIDER_SITE_OTHER): Payer: BC Managed Care – PPO | Admitting: Family Medicine

## 2013-06-23 ENCOUNTER — Encounter: Payer: Self-pay | Admitting: Family Medicine

## 2013-06-23 VITALS — BP 120/76 | HR 71 | Temp 98.2°F | Resp 16 | Wt 156.0 lb

## 2013-06-23 DIAGNOSIS — F418 Other specified anxiety disorders: Secondary | ICD-10-CM

## 2013-06-23 DIAGNOSIS — F341 Dysthymic disorder: Secondary | ICD-10-CM

## 2013-06-23 MED ORDER — VENLAFAXINE HCL ER 37.5 MG PO CP24: ORAL_CAPSULE | ORAL | Status: DC

## 2013-06-23 NOTE — Progress Notes (Signed)
Pre visit review using our clinic review tool, if applicable. No additional management support is needed unless otherwise documented below in the visit note. 

## 2013-06-23 NOTE — Progress Notes (Signed)
   Subjective:    Patient ID: [name], female    DOB: 05-26-1960, 53 y.o.   MRN: [id number]  HPI Depression/anxiety- 'i can definitely tell a difference for the better' but reports increased sleepiness.  'i've slept the last 3 weekends'.  Pt states she's having a hard time staying awake at work.   Review of Systems For ROS see HPI     Objective:   Physical Exam  Vitals reviewed. Constitutional: She is oriented to person, place, and time. She appears well-developed and well-nourished. No distress.  Neurological: She is alert and oriented to person, place, and time.  Psychiatric: She has a normal mood and affect. Her behavior is normal. Thought content normal.          Assessment & Plan:

## 2013-06-23 NOTE — Patient Instructions (Signed)
Follow up in 4-6 weeks to recheck mood STOP the prozac START the effexor daily in the AM- start w/ 1 tab and increase to 2 tabs after 2 weeks Call with any questions or concerns Keep up the good work!  You look great!

## 2013-06-23 NOTE — Assessment & Plan Note (Signed)
Pt's sxs have improved w/ addition of SSRI but is now having excessive fatigue.  Switch to SNRI for increased energy.  Will continue to follow.

## 2013-07-11 ENCOUNTER — Ambulatory Visit
Admission: RE | Admit: 2013-07-11 | Discharge: 2013-07-11 | Disposition: A | Discharge status: Home / Self Care | Payer: BC Managed Care – PPO | Source: Ambulatory Visit | Attending: Family Medicine | Admitting: Family Medicine

## 2013-07-11 DIAGNOSIS — N63 Unspecified lump in unspecified breast: Secondary | ICD-10-CM

## 2013-07-27 ENCOUNTER — Encounter: Payer: Self-pay | Admitting: Family Medicine

## 2013-07-27 ENCOUNTER — Ambulatory Visit (INDEPENDENT_AMBULATORY_CARE_PROVIDER_SITE_OTHER): Payer: BC Managed Care – PPO | Admitting: Family Medicine

## 2013-07-27 VITALS — BP 120/74 | HR 74 | Temp 98.0°F | Resp 16 | Wt 156.4 lb

## 2013-07-27 DIAGNOSIS — F341 Dysthymic disorder: Secondary | ICD-10-CM

## 2013-07-27 DIAGNOSIS — G4700 Insomnia, unspecified: Secondary | ICD-10-CM | POA: Insufficient documentation

## 2013-07-27 DIAGNOSIS — F418 Other specified anxiety disorders: Secondary | ICD-10-CM

## 2013-07-27 MED ORDER — TRAZODONE HCL 50 MG PO TABS: 25.0000 mg | ORAL_TABLET | Freq: Every evening | ORAL | Status: DC | PRN

## 2013-07-27 MED ORDER — VENLAFAXINE HCL ER 75 MG PO TB24: 1.0000 | ORAL_TABLET | Freq: Every day | ORAL | Status: DC

## 2013-07-27 NOTE — Progress Notes (Signed)
Pre visit review using our clinic review tool, if applicable. No additional management support is needed unless otherwise documented below in the visit note. 

## 2013-07-27 NOTE — Patient Instructions (Signed)
Follow up in 6-8 weeks to discuss fatigue and insomnia Continue the Venlafaxine- we'll increase the prescription to 75mg  daily (1 tab) Start the Trazodone for sleep- start w/ 1/2 tab nightly and increase if needed Call with any questions or concerns Hang in there! Happy Early Rudene Anda!!!

## 2013-07-27 NOTE — Progress Notes (Signed)
   Subjective:    Patient ID: [name], female    DOB: 1960/10/06, 53 y.o.   MRN: [id number]  HPI Depression- sxs have improved w/ addition of SNRI.  Continues to have fatigue and feels this may be perimenopausal sxs.  Periods are not irregular but starting to become heavier.  Having more energy than she did on the Prozac.  Taking MVI daily.  + insomnia   Review of Systems For ROS see HPI     Objective:   Physical Exam  Vitals reviewed. Constitutional: She is oriented to person, place, and time. She appears well-developed and well-nourished. No distress.  HENT:  Head: Normocephalic and atraumatic.  Neurological: She is alert and oriented to person, place, and time. No cranial nerve deficit.  Skin: Skin is warm and dry.  Psychiatric: She has a normal mood and affect. Her behavior is normal. Thought content normal.          Assessment & Plan:

## 2013-07-28 NOTE — Assessment & Plan Note (Signed)
Improved since switching to SNRI.  Will continue to monitor.

## 2013-07-28 NOTE — Assessment & Plan Note (Signed)
New.  Start trazodone daily.  Monitor for improvement in sleep.

## 2013-09-09 ENCOUNTER — Encounter: Payer: Self-pay | Admitting: Family Medicine

## 2013-09-09 ENCOUNTER — Ambulatory Visit (INDEPENDENT_AMBULATORY_CARE_PROVIDER_SITE_OTHER): Payer: BC Managed Care – PPO | Admitting: Family Medicine

## 2013-09-09 VITALS — BP 120/70 | HR 77 | Temp 98.4°F | Resp 16 | Wt 153.2 lb

## 2013-09-09 DIAGNOSIS — N924 Excessive bleeding in the premenopausal period: Secondary | ICD-10-CM

## 2013-09-09 DIAGNOSIS — F418 Other specified anxiety disorders: Secondary | ICD-10-CM

## 2013-09-09 DIAGNOSIS — F341 Dysthymic disorder: Secondary | ICD-10-CM

## 2013-09-09 LAB — CBC WITH DIFFERENTIAL/PLATELET
Basophils Absolute: 0.1 10*3/uL (ref 0.0–0.1)
Basophils Relative: 1 % (ref 0–1)
EOS PCT: 3 % (ref 0–5)
Eosinophils Absolute: 0.2 10*3/uL (ref 0.0–0.7)
HEMATOCRIT: 30.1 % — AB (ref 36.0–46.0)
HEMOGLOBIN: 9.4 g/dL — AB (ref 12.0–15.0)
LYMPHS ABS: 1.8 10*3/uL (ref 0.7–4.0)
LYMPHS PCT: 28 % (ref 12–46)
MCH: 22.2 pg — ABNORMAL LOW (ref 26.0–34.0)
MCHC: 31.2 g/dL (ref 30.0–36.0)
MCV: 71.2 fL — AB (ref 78.0–100.0)
MONO ABS: 0.8 10*3/uL (ref 0.1–1.0)
MONOS PCT: 12 % (ref 3–12)
Neutro Abs: 3.5 10*3/uL (ref 1.7–7.7)
Neutrophils Relative %: 56 % (ref 43–77)
Platelets: 413 10*3/uL — ABNORMAL HIGH (ref 150–400)
RBC: 4.23 MIL/uL (ref 3.87–5.11)
RDW: 17.4 % — ABNORMAL HIGH (ref 11.5–15.5)
WBC: 6.3 10*3/uL (ref 4.0–10.5)

## 2013-09-09 NOTE — Patient Instructions (Signed)
Follow up as needed We'll notify you of your blood count Make sure you are taking an iron supplement daily (may cause constipation so add a stool softener) Call with any questions or concerns Keep up the good work!  You look great!

## 2013-09-09 NOTE — Progress Notes (Signed)
Pre visit review using our clinic review tool, if applicable. No additional management support is needed unless otherwise documented below in the visit note. 

## 2013-09-09 NOTE — Progress Notes (Signed)
   Subjective:    Patient ID: [name], female    DOB: 1961/02/13, 53 y.o.   MRN: [id number]  HPI Premenopausal menorrhagia- pt reports she has had her cycle x10 days w/ very heavy bleeding.  Has been 'on time' x3 months.  Reporting extreme fatigue prior to menses.  Yesterday had short dizzy spell.    Depression- improved since starting effexor.  'i should've been on this stuff a long time ago'.   Review of Systems For ROS see HPI     Objective:   Physical Exam  Vitals reviewed. Constitutional: She is oriented to person, place, and time. She appears well-developed and well-nourished. No distress.  HENT:  Head: Normocephalic and atraumatic.  Eyes: Conjunctivae and EOM are normal. Pupils are equal, round, and reactive to light.  Neck: Normal range of motion. Neck supple. No thyromegaly present.  Cardiovascular: Normal rate, regular rhythm, normal heart sounds and intact distal pulses.   No murmur heard. Pulmonary/Chest: Effort normal and breath sounds normal. No respiratory distress.  Abdominal: Soft. She exhibits no distension. There is no tenderness.  Musculoskeletal: She exhibits no edema.  Lymphadenopathy:    She has no cervical adenopathy.  Neurological: She is alert and oriented to person, place, and time.  Skin: Skin is warm and dry.  Psychiatric: She has a normal mood and affect. Her behavior is normal.          Assessment & Plan:

## 2013-09-11 NOTE — Assessment & Plan Note (Signed)
Pt reports very heavy flow.  Check CBC.  May need GYN referral if sxs persist.  Pt expressed understanding and is in agreement w/ plan.

## 2013-09-11 NOTE — Assessment & Plan Note (Signed)
Much improved since starting meds.  Pt feels sxs are well controlled.  No med changes at this time.

## 2013-09-12 ENCOUNTER — Emergency Department (HOSPITAL_COMMUNITY)
Admission: EM | Admit: 2013-09-12 | Discharge: 2013-09-13 | Disposition: A | Discharge status: Home / Self Care | Payer: BC Managed Care – PPO | Attending: Emergency Medicine | Admitting: Emergency Medicine

## 2013-09-12 ENCOUNTER — Other Ambulatory Visit: Payer: Self-pay | Admitting: Family Medicine

## 2013-09-12 ENCOUNTER — Encounter (HOSPITAL_COMMUNITY): Payer: Self-pay | Admitting: Emergency Medicine

## 2013-09-12 DIAGNOSIS — N949 Unspecified condition associated with female genital organs and menstrual cycle: Secondary | ICD-10-CM | POA: Insufficient documentation

## 2013-09-12 DIAGNOSIS — F411 Generalized anxiety disorder: Secondary | ICD-10-CM | POA: Insufficient documentation

## 2013-09-12 DIAGNOSIS — N920 Excessive and frequent menstruation with regular cycle: Secondary | ICD-10-CM

## 2013-09-12 DIAGNOSIS — F329 Major depressive disorder, single episode, unspecified: Secondary | ICD-10-CM | POA: Insufficient documentation

## 2013-09-12 DIAGNOSIS — R42 Dizziness and giddiness: Secondary | ICD-10-CM | POA: Insufficient documentation

## 2013-09-12 DIAGNOSIS — N938 Other specified abnormal uterine and vaginal bleeding: Secondary | ICD-10-CM | POA: Insufficient documentation

## 2013-09-12 DIAGNOSIS — Z79899 Other long term (current) drug therapy: Secondary | ICD-10-CM | POA: Insufficient documentation

## 2013-09-12 DIAGNOSIS — E785 Hyperlipidemia, unspecified: Secondary | ICD-10-CM | POA: Insufficient documentation

## 2013-09-12 DIAGNOSIS — R32 Unspecified urinary incontinence: Secondary | ICD-10-CM | POA: Insufficient documentation

## 2013-09-12 DIAGNOSIS — D649 Anemia, unspecified: Secondary | ICD-10-CM | POA: Insufficient documentation

## 2013-09-12 DIAGNOSIS — F3289 Other specified depressive episodes: Secondary | ICD-10-CM | POA: Insufficient documentation

## 2013-09-12 DIAGNOSIS — N925 Other specified irregular menstruation: Secondary | ICD-10-CM | POA: Insufficient documentation

## 2013-09-12 DIAGNOSIS — R011 Cardiac murmur, unspecified: Secondary | ICD-10-CM | POA: Insufficient documentation

## 2013-09-12 DIAGNOSIS — R110 Nausea: Secondary | ICD-10-CM | POA: Insufficient documentation

## 2013-09-12 LAB — ABO/RH: ABO/RH(D): O POS

## 2013-09-12 LAB — COMPREHENSIVE METABOLIC PANEL
ALK PHOS: 66 U/L (ref 39–117)
ALT: 19 U/L (ref 0–35)
AST: 19 U/L (ref 0–37)
Albumin: 4 g/dL (ref 3.5–5.2)
BUN: 12 mg/dL (ref 6–23)
CO2: 25 mEq/L (ref 19–32)
Calcium: 9.1 mg/dL (ref 8.4–10.5)
Chloride: 100 mEq/L (ref 96–112)
Creatinine, Ser: 0.89 mg/dL (ref 0.50–1.10)
GFR calc Af Amer: 86 mL/min — ABNORMAL LOW (ref >90)
GFR calc non Af Amer: 74 mL/min — ABNORMAL LOW (ref >90)
GLUCOSE: 120 mg/dL — AB (ref 70–99)
POTASSIUM: 3.8 meq/L (ref 3.7–5.3)
SODIUM: 138 meq/L (ref 137–147)
TOTAL PROTEIN: 7.2 g/dL (ref 6.0–8.3)

## 2013-09-12 LAB — CBC
HEMATOCRIT: 30 % — AB (ref 36.0–46.0)
HEMOGLOBIN: 9.2 g/dL — AB (ref 12.0–15.0)
MCH: 22.3 pg — AB (ref 26.0–34.0)
MCHC: 30.7 g/dL (ref 30.0–36.0)
MCV: 72.8 fL — ABNORMAL LOW (ref 78.0–100.0)
Platelets: 362 10*3/uL (ref 150–400)
RBC: 4.12 MIL/uL (ref 3.87–5.11)
RDW: 16.6 % — ABNORMAL HIGH (ref 11.5–15.5)
WBC: 5.3 10*3/uL (ref 4.0–10.5)

## 2013-09-12 LAB — TYPE AND SCREEN
ABO/RH(D): O POS
ANTIBODY SCREEN: NEGATIVE

## 2013-09-12 NOTE — ED Notes (Signed)
Reports heavy bleeding with clots x 13 days. Having episodes of dizziness. Went to pcp and had blood checked, hbg 9.5. No acute distress noted at triage.

## 2013-09-12 NOTE — ED Notes (Signed)
Pt st's she has had vag. Bleeding x's 13 days.  Pt st's past 2 cycles have been normal.  Pt alert and oriented x's 3.

## 2013-09-13 MED ORDER — MEDROXYPROGESTERONE ACETATE 10 MG PO TABS: 10.0000 mg | ORAL_TABLET | Freq: Every day | ORAL | Status: DC

## 2013-09-13 NOTE — ED Provider Notes (Signed)
CSN: [id number]     Arrival date & time 09/12/13  1832 History   First MD Initiated Contact with Patient 09/12/13 2302     Chief Complaint  Patient presents with  . Vaginal Bleeding  . Dizziness     (Consider location/radiation/quality/duration/timing/severity/associated sxs/prior Treatment) HPI Patient is perimenopausal and is having extended periods of the last few months. She states she's been bleeding for 13 days this month. The amount of blood varies per day. She states the blood is mostly red and fresh appearing but she is occasionally passing clots. She's having episodic dizzy episodes and had an episode this afternoon. She states she was lying down when it started. She describes the dizziness as lightheadedness and feeling like she was going to pass out. She had some mild nausea. The dizziness has since resolved. She is currently asymptomatic. She denies any abdominal pain or chest pain at any point. No shortness of breath. She has yet to see an OB GYN. She states her mother had benign uterine tumors and required hysterectomy. Past Medical History  Diagnosis Date  . Heart murmur   . Hyperlipidemia   . Incontinence of urine   . Anemia   . Anxiety   . Depression    Past Surgical History  Procedure Laterality Date  . Tubal ligation    . Wisdom tooth extraction     Family History  Problem Relation Age of Onset  . Diabetes Mother   . Cancer Mother     colon cancer and breast cancer  . Depression Mother     Manic depression  . Heart disease Father   . Hypertension Father   . Cancer Maternal Grandmother     ovarian cancer  . Stomach cancer Paternal Aunt   . Colon cancer Neg Hx   . Esophageal cancer Neg Hx   . Rectal cancer Neg Hx    History  Substance Use Topics  . Smoking status: Never Smoker   . Smokeless tobacco: Never Used  . Alcohol Use: No   OB History   Grav Para Term Preterm Abortions TAB SAB Ect Mult Living                 Review of Systems   Constitutional: Negative for fever, chills and fatigue.  Respiratory: Negative for cough and shortness of breath.   Cardiovascular: Negative for chest pain.  Gastrointestinal: Positive for nausea. Negative for vomiting, abdominal pain, diarrhea and constipation.  Genitourinary: Positive for vaginal bleeding. Negative for dysuria, flank pain, vaginal discharge and pelvic pain.  Musculoskeletal: Negative for back pain, myalgias, neck pain and neck stiffness.  Skin: Negative for rash and wound.  Neurological: Positive for dizziness and light-headedness. Negative for syncope, weakness, numbness and headaches.  All other systems reviewed and are negative.     Allergies  Review of patient's allergies indicates no known allergies.  Home Medications   Prior to Admission medications   Medication Sig Start Date End Date Taking? Authorizing Provider  calcium carbonate (TUMS - DOSED IN MG ELEMENTAL CALCIUM) 500 MG chewable tablet Chew 1 tablet by mouth as needed.   Yes Historical Provider, MD  cholecalciferol (VITAMIN D) 1000 UNITS tablet Take 1,000 Units by mouth daily.   Yes Historical Provider, MD  Cyanocobalamin (VITAMIN B 12) 100 MCG LOZG Take 1,000 mg by mouth daily.   Yes Historical Provider, MD  fish oil-omega-3 fatty acids 1000 MG capsule Take 1 g by mouth 3 (three) times daily.   Yes Historical Provider, MD  Multiple Vitamin (MULTIVITAMIN) capsule Take 1 capsule by mouth daily.   Yes Historical Provider, MD  Venlafaxine HCl 75 MG TB24 Take 1 tablet (75 mg total) by mouth daily. 07/27/13  Yes Midge Minium, MD   BP 142/75  Pulse 53  Temp(Src) 98 F (36.7 C) (Oral)  Resp 14  Ht 5\' 4"  (1.626 m)  Wt 153 lb (69.4 kg)  BMI 26.25 kg/m2  SpO2 100%  LMP 08/29/2013 Physical Exam  Nursing note and vitals reviewed. Constitutional: She is oriented to person, place, and time. She appears well-developed and well-nourished. No distress.  HENT:  Head: Normocephalic and atraumatic.   Mouth/Throat: Oropharynx is clear and moist.  Eyes: EOM are normal. Pupils are equal, round, and reactive to light.  Neck: Normal range of motion. Neck supple.  Cardiovascular: Normal rate and regular rhythm.   Pulmonary/Chest: Effort normal and breath sounds normal. No respiratory distress. She has no wheezes. She has no rales. She exhibits no tenderness.  Abdominal: Soft. Bowel sounds are normal. She exhibits no distension and no mass. There is no tenderness. There is no rebound and no guarding.  Genitourinary:  Mild to moderate amount of dark blood in the vaginal vault. No blood clots noted. No tenderness to palpation of the uterine fundus or adnexa. Questionable masses of the uterus palpated.  Musculoskeletal: Normal range of motion. She exhibits no edema and no tenderness.  No calf swelling or tenderness.  Neurological: She is alert and oriented to person, place, and time.  Patient is alert and oriented x3 with clear, goal oriented speech. Patient has 5/5 motor in all extremities. Sensation is intact to light touch. Bilateral finger-to-nose is normal with no signs of dysmetria. Patient has a normal gait and walks without assistance.   Skin: Skin is warm and dry. No rash noted. No erythema.  Psychiatric: She has a normal mood and affect. Her behavior is normal.    ED Course  Procedures (including critical care time) Labs Review Labs Reviewed  CBC - Abnormal; Notable for the following:    Hemoglobin 9.2 (*)    HCT 30.0 (*)    MCV 72.8 (*)    MCH 22.3 (*)    RDW 16.6 (*)    All other components within normal limits  COMPREHENSIVE METABOLIC PANEL - Abnormal; Notable for the following:    Glucose, Bld 120 (*)    Total Bilirubin <0.2 (*)    GFR calc non Af Amer 74 (*)    GFR calc Af Amer 86 (*)    All other components within normal limits  TYPE AND SCREEN  ABO/RH    Imaging Review No results found.   EKG Interpretation None      Date: 09/13/2013  Rate: 55  Rhythm:  normal sinus rhythm  QRS Axis: normal  Intervals: normal  ST/T Wave abnormalities: normal  Conduction Disutrbances:none  Narrative Interpretation:   Old EKG Reviewed: Unchanged   MDM   Final diagnoses:  Dysfunctional uterine bleeding   Discussed with Dr. Roselie Awkward. He recommends having the patient start Provera 10 mg every day for 14 days and following up as an outpatient with OB/GYN. The patient has had no more dizzy episodes in the emergency department is not orthostatic. I believe that the dizzy episode the patient had earlier are likely vagal in nature. I believe that she does not require blood transfusion at this time. She's been given strict return precautions and has voiced understanding.     Julianne Rice, MD 09/13/13 [phone]

## 2013-09-13 NOTE — Discharge Instructions (Signed)
Call and make appointment to followup at [facility] clinic. Return immediately for worsening bleeding, lightheadedness or for any concerns. Take medication as prescribed.   Abnormal Uterine Bleeding Abnormal uterine bleeding can affect women at various stages in life, including teenagers, women in their reproductive years, pregnant women, and women who have reached menopause. Several kinds of uterine bleeding are considered abnormal, including:  Bleeding or spotting between periods.   Bleeding after sexual intercourse.   Bleeding that is heavier or more than normal.   Periods that last longer than usual.  Bleeding after menopause.  Many cases of abnormal uterine bleeding are minor and simple to treat, while others are more serious. Any type of abnormal bleeding should be evaluated by your health care provider. Treatment will depend on the cause of the bleeding. HOME CARE INSTRUCTIONS Monitor your condition for any changes. The following actions may help to alleviate any discomfort you are experiencing:  Avoid the use of tampons and douches as directed by your health care provider.  Change your pads frequently. You should get regular pelvic exams and Pap tests. Keep all follow-up appointments for diagnostic tests as directed by your health care provider.  SEEK MEDICAL CARE IF:   Your bleeding lasts more than 1 week.   You feel dizzy at times.  SEEK IMMEDIATE MEDICAL CARE IF:   You pass out.   You are changing pads every 15 to 30 minutes.   You have abdominal pain.  You have a fever.   You become sweaty or weak.   You are passing large blood clots from the vagina.   You start to feel nauseous and vomit. MAKE SURE YOU:   Understand these instructions.  Will watch your condition.  Will get help right away if you are not doing well or get worse. Document Released: 04/28/2005 Document Revised: 12/29/2012 Document Reviewed: 11/25/2012 [facility] Patient Information  2014 [place], [place].

## 2013-09-14 ENCOUNTER — Other Ambulatory Visit: Payer: Self-pay | Admitting: Gynecology

## 2013-09-16 ENCOUNTER — Telehealth: Payer: Self-pay | Admitting: General Practice

## 2013-09-16 NOTE — Telephone Encounter (Signed)
Patient has never been seen here in our office. Called patient, no answer- left message stating I am trying to return her phone call, she may call us back at the clinics if she still needs assistance

## 2013-09-16 NOTE — Telephone Encounter (Signed)
Patient called and left message stating she was wondering if we had her biopsy results yet.

## 2013-09-19 NOTE — Telephone Encounter (Signed)
Pt. Returned call stating she was calling for results. Pt. Was not seen here in our clinic and is not our patient. Called pt. No answer. Left message stating she may call us if she still has questions.

## 2013-10-13 ENCOUNTER — Encounter: Payer: Self-pay | Admitting: Family Medicine

## 2013-10-13 ENCOUNTER — Ambulatory Visit (INDEPENDENT_AMBULATORY_CARE_PROVIDER_SITE_OTHER): Payer: BC Managed Care – PPO | Admitting: Family Medicine

## 2013-10-13 VITALS — BP 120/72 | HR 76 | Temp 98.0°F | Resp 16 | Wt 159.2 lb

## 2013-10-13 DIAGNOSIS — R5383 Other fatigue: Principal | ICD-10-CM

## 2013-10-13 DIAGNOSIS — R5381 Other malaise: Secondary | ICD-10-CM

## 2013-10-13 DIAGNOSIS — L259 Unspecified contact dermatitis, unspecified cause: Secondary | ICD-10-CM

## 2013-10-13 DIAGNOSIS — L309 Dermatitis, unspecified: Secondary | ICD-10-CM | POA: Insufficient documentation

## 2013-10-13 MED ORDER — TRIAMCINOLONE ACETONIDE 0.1 % EX OINT: 1.0000 "application " | TOPICAL_OINTMENT | Freq: Two times a day (BID) | CUTANEOUS | Status: DC

## 2013-10-13 NOTE — Assessment & Plan Note (Signed)
New.  Pt's dry skin and patchy 'rash' consistent w/ eczema.  Start topical steroid ointment.  Reviewed supportive care and red flags that should prompt return.  Pt expressed understanding and is in agreement w/ plan.

## 2013-10-13 NOTE — Progress Notes (Signed)
Pre visit review using our clinic review tool, if applicable. No additional management support is needed unless otherwise documented below in the visit note. 

## 2013-10-13 NOTE — Progress Notes (Signed)
   Subjective:    Patient ID: [name], female    DOB: 12-19-1960, 53 y.o.   MRN: [id number]  Rash   Fatigue- pt having premenopausal menorrhagia.  Had appt w/ GYN and f/u tomorrow.  Taking 'extra iron' and extra B12, still having fatigue.  No known snoring.  Pt will wake feeling poorly rested, hx of insomnia.  'i just feel lousy'.  Denies CP, SOB, HAs, visual changes, N/V.  Rash- occuring on bilateral wrists and forearms.  Not itchy.  Skin is 'rough, sorta bumpy'.  sxs will resolve and then return.  Will have blotchy appearance.  sxs 1st started 2-4 weeks ago.  No one else w/ similar rash.   Review of Systems  Skin: Positive for rash.   For ROS see HPI     Objective:   Physical Exam  Vitals reviewed. Constitutional: She is oriented to person, place, and time. She appears well-developed and well-nourished. No distress.  HENT:  Head: Normocephalic and atraumatic.  Eyes: EOM are normal. Pupils are equal, round, and reactive to light.  Mild conjunctival pallor  Neck: Normal range of motion. Neck supple. No thyromegaly present.  Cardiovascular: Normal rate, regular rhythm, normal heart sounds and intact distal pulses.   No murmur heard. Pulmonary/Chest: Effort normal and breath sounds normal. No respiratory distress.  Abdominal: Soft. She exhibits no distension. There is no tenderness.  Musculoskeletal: She exhibits no edema.  Lymphadenopathy:    She has no cervical adenopathy.  Neurological: She is alert and oriented to person, place, and time.  Skin: Skin is warm and dry. There is erythema (mild dryness w/ some eczematous patches on arms bilaterally).  Psychiatric: She has a normal mood and affect. Her behavior is normal.          Assessment & Plan:

## 2013-10-13 NOTE — Patient Instructions (Signed)
Follow up as needed We'll notify you of your lab results and make any changes if needed Continue your daily iron supplement Use the triamcinolone twice daily on the rash Call with any questions or concerns Hang in there!!!

## 2013-10-13 NOTE — Assessment & Plan Note (Signed)
New.  Pt w/ known history of iron deficiency anemia due to menorrhagia- following w/ GYN.  Suspect this is the cause but will need to r/o other possibilities- thyroid, electrolyte abnormality.  Check labs.  Reviewed supportive care and red flags that should prompt return.  Pt expressed understanding and is in agreement w/ plan.

## 2013-10-14 ENCOUNTER — Encounter: Payer: Self-pay | Admitting: General Practice

## 2013-10-14 LAB — CBC WITH DIFFERENTIAL/PLATELET
BASOS ABS: 0 10*3/uL (ref 0.0–0.1)
Basophils Relative: 0.4 % (ref 0.0–3.0)
EOS ABS: 0.4 10*3/uL (ref 0.0–0.7)
Eosinophils Relative: 6.1 % — ABNORMAL HIGH (ref 0.0–5.0)
HEMATOCRIT: 38.1 % (ref 36.0–46.0)
Hemoglobin: 12.1 g/dL (ref 12.0–15.0)
LYMPHS ABS: 1.3 10*3/uL (ref 0.7–4.0)
Lymphocytes Relative: 22.6 % (ref 12.0–46.0)
MCHC: 31.8 g/dL (ref 30.0–36.0)
MCV: 79.9 fl (ref 78.0–100.0)
MONO ABS: 0.5 10*3/uL (ref 0.1–1.0)
Monocytes Relative: 8 % (ref 3.0–12.0)
Neutro Abs: 3.7 10*3/uL (ref 1.4–7.7)
Neutrophils Relative %: 62.9 % (ref 43.0–77.0)
PLATELETS: 235 10*3/uL (ref 150.0–400.0)
RBC: 4.77 Mil/uL (ref 3.87–5.11)
RDW: 25.3 % — ABNORMAL HIGH (ref 11.5–15.5)
WBC: 5.9 10*3/uL (ref 4.0–10.5)

## 2013-10-14 LAB — BASIC METABOLIC PANEL
BUN: 11 mg/dL (ref 6–23)
CHLORIDE: 106 meq/L (ref 96–112)
CO2: 26 meq/L (ref 19–32)
Calcium: 9 mg/dL (ref 8.4–10.5)
Creatinine, Ser: 0.8 mg/dL (ref 0.4–1.2)
GFR: 82.7 mL/min (ref >60.00)
Glucose, Bld: 93 mg/dL (ref 70–99)
Potassium: 3.7 mEq/L (ref 3.5–5.1)
Sodium: 139 mEq/L (ref 135–145)

## 2013-10-14 LAB — HEPATIC FUNCTION PANEL
ALT: 18 U/L (ref 0–35)
AST: 19 U/L (ref 0–37)
Albumin: 3.6 g/dL (ref 3.5–5.2)
Alkaline Phosphatase: 60 U/L (ref 39–117)
BILIRUBIN TOTAL: 0.3 mg/dL (ref 0.2–1.2)
Bilirubin, Direct: 0 mg/dL (ref 0.0–0.3)
Total Protein: 6.3 g/dL (ref 6.0–8.3)

## 2013-10-14 LAB — TSH: TSH: 0.76 u[IU]/mL (ref 0.35–4.50)

## 2013-11-23 ENCOUNTER — Encounter: Payer: Self-pay | Admitting: Family Medicine

## 2013-11-23 ENCOUNTER — Ambulatory Visit (INDEPENDENT_AMBULATORY_CARE_PROVIDER_SITE_OTHER): Payer: BC Managed Care – PPO | Admitting: Family Medicine

## 2013-11-23 VITALS — BP 120/78 | HR 66 | Temp 98.2°F | Resp 16 | Wt 163.2 lb

## 2013-11-23 DIAGNOSIS — M62830 Muscle spasm of back: Secondary | ICD-10-CM

## 2013-11-23 DIAGNOSIS — M5380 Other specified dorsopathies, site unspecified: Secondary | ICD-10-CM

## 2013-11-23 MED ORDER — CYCLOBENZAPRINE HCL 10 MG PO TABS: 10.0000 mg | ORAL_TABLET | Freq: Every day | ORAL | Status: DC

## 2013-11-23 MED ORDER — MELOXICAM 15 MG PO TABS: 15.0000 mg | ORAL_TABLET | Freq: Every day | ORAL | Status: DC

## 2013-11-23 NOTE — Patient Instructions (Signed)
Follow up as needed Start the Mobic once daily- w/ food- for inflammation Use the flexeril at night as needed for spasm- will cause drowsiness HEAT! If no improvement in the next 1-2 weeks, call me and we'll send you to Sports Medicine Call with any questions or concerns Hang in there!!!

## 2013-11-23 NOTE — Progress Notes (Signed)
Pre visit review using our clinic review tool, if applicable. No additional management support is needed unless otherwise documented below in the visit note. 

## 2013-11-23 NOTE — Progress Notes (Signed)
   Subjective:    Patient ID: [name], female    DOB: Dec 10, 1960, 53 y.o.   MRN: [id number]  HPI Back pain- pt has hx of degenerative disc in back (dx'd by chiropractic 2-3 yrs ago).  Stopped exercising 2-3 months ago.  Pt report low back is now 'popping and cracking and real sore'.  Pain is predominately on R side.  No weakness or numbness of legs, no radiation of pain into butt or thighs.  Sits all day at work- this is uncomfortable.  sxs started gradually since she stopped her activity.   Review of Systems For ROS see HPI     Objective:   Physical Exam  Vitals reviewed. Constitutional: She is oriented to person, place, and time. She appears well-developed and well-nourished. No distress.  Cardiovascular: Intact distal pulses.   Musculoskeletal: She exhibits tenderness (mild lumbar paraspinal tenderness and spasm on R). She exhibits no edema.  No TTP over spine Good flexion and extension of spine  Neurological: She is alert and oriented to person, place, and time. She has normal reflexes. No cranial nerve deficit. Coordination normal.  (-) SLR bilaterally  Skin: Skin is warm and dry.          Assessment & Plan:

## 2013-11-23 NOTE — Assessment & Plan Note (Signed)
New.  Pt's hx and PE consistent w/ spasm.  No red flags on hx or PE.  Start scheduled daily NSAID, flexeril prn.  Heat.  If no improvement, will refer to sports med for evaluation and tx.  Pt expressed understanding and is in agreement w/ plan.

## 2013-11-28 ENCOUNTER — Telehealth: Payer: Self-pay | Admitting: Family Medicine

## 2013-11-28 DIAGNOSIS — M62830 Muscle spasm of back: Secondary | ICD-10-CM

## 2013-11-28 NOTE — Telephone Encounter (Signed)
Referral placed per last ov note. Pt notified.

## 2013-11-28 NOTE — Telephone Encounter (Signed)
Caller name: Kevonna Relation to pt:  Call back number:[phone]   Reason for call:   Pt is wanting a referral to a sports medicine or otho doctor.  Pt thinks she has a pinched nerve.  You can leave message with Pt's father, Abe People,

## 2013-12-01 ENCOUNTER — Other Ambulatory Visit: Payer: Self-pay | Admitting: Sports Medicine

## 2013-12-01 DIAGNOSIS — M545 Low back pain, unspecified: Secondary | ICD-10-CM

## 2013-12-10 ENCOUNTER — Other Ambulatory Visit: Payer: BC Managed Care – PPO

## 2014-01-18 ENCOUNTER — Telehealth: Payer: Self-pay | Admitting: Family Medicine

## 2014-01-18 NOTE — Telephone Encounter (Signed)
Pt should be taking OTC iron supplement.  Also, if still having long periods, needs to f/u w/ GYN

## 2014-01-18 NOTE — Telephone Encounter (Signed)
Caller name: Sheriece Relation to pt: self Call back number: [phone]  Pharmacy:  Reason for call:   Patient states that she is having another lengthy period and wants to know if she should be taking iron pills

## 2014-01-19 NOTE — Telephone Encounter (Signed)
Called and left a detailed message on voicemail for pt.

## 2014-04-14 ENCOUNTER — Ambulatory Visit
Admission: RE | Admit: 2014-04-14 | Discharge: 2014-04-14 | Disposition: A | Discharge status: Home / Self Care | Payer: BC Managed Care – PPO | Source: Ambulatory Visit | Attending: Sports Medicine | Admitting: Sports Medicine

## 2014-04-14 DIAGNOSIS — M545 Low back pain, unspecified: Secondary | ICD-10-CM

## 2014-07-03 ENCOUNTER — Encounter: Payer: Self-pay | Admitting: Medical

## 2014-07-03 ENCOUNTER — Ambulatory Visit (INDEPENDENT_AMBULATORY_CARE_PROVIDER_SITE_OTHER): Payer: BLUE CROSS/BLUE SHIELD | Admitting: Medical

## 2014-07-03 VITALS — BP 143/84 | HR 59 | Temp 98.0°F | Ht 64.75 in | Wt 148.2 lb

## 2014-07-03 DIAGNOSIS — M5136 Other intervertebral disc degeneration, lumbar region: Secondary | ICD-10-CM | POA: Insufficient documentation

## 2014-07-03 DIAGNOSIS — F418 Other specified anxiety disorders: Secondary | ICD-10-CM

## 2014-07-03 DIAGNOSIS — M5126 Other intervertebral disc displacement, lumbar region: Secondary | ICD-10-CM

## 2014-07-03 MED ORDER — CLONAZEPAM 0.5 MG PO TABS: 0.2500 mg | ORAL_TABLET | Freq: Two times a day (BID) | ORAL | Status: DC | PRN

## 2014-07-03 MED ORDER — SERTRALINE HCL 25 MG PO TABS: 25.0000 mg | ORAL_TABLET | Freq: Every day | ORAL | Status: DC

## 2014-07-03 NOTE — Assessment & Plan Note (Addendum)
Hx of this before. Since end of 2 relationships all in last year anxiety recently reoccured.   Pt tried some venlafaxine in the past. She tried this recenlty and this did not help much.   Today at work felt real anxious at work and started to cry.   Will rx low dose sertraline. Follow up in 2 wks.   Rx brief course/low dose. of clonzapam if needed over next week.

## 2014-07-03 NOTE — Patient Instructions (Addendum)
Depression with anxiety Hx of this before. Since end of 2 relationships all in last year anxiety recently reoccured.   Pt tried some venlafaxine in the past. She tried this recenlty and this did not help much.   Today at work felt real anxious at work and started to cry.   Will rx low dose sertraline. Follow up in 2 wks.   Rx brief course/low dose. of clonzapam if needed over next week.      Bulging lumbar disc With some anterolisthesis.  Recommend follow up with back specialist. Recommend PT referral pending specialist appointment.    Follow up here in 4-6 weeks.   Note for anxiety plan Brief course clonazapam.

## 2014-07-03 NOTE — Assessment & Plan Note (Addendum)
With some anterolisthesis.  Recommend follow up with back specialist. Recommend PT referral pending specialist appointment.

## 2014-07-03 NOTE — Progress Notes (Signed)
Pre visit review using our clinic review tool, if applicable. No additional management support is needed unless otherwise documented below in the visit note. 

## 2014-07-03 NOTE — Progress Notes (Signed)
Subjective:    Patient ID: [name], female    DOB: 08-03-1960, 54 y.o.   MRN: [id number]  HPI   Pt in for back pain. Pt has hx of spinal stenosis(although on mri I did not see this on report but did see some possible foraminal stenosis)  and   some degenerative disk disease. Pt states states history of back pain that started about one year ago. She tells me back pain started when she stopped exercising and sleeping more. But later in exam describes more just crepitus that causes worry and rare back pain.   Pt went to back and spine specialist. He told her to continue exercise. Keep core muscles in shape and back muscle in shape. Pt has appointment march 15 th with specialist again.  Pt states she also has some anxiety related to her new boyfriend leaving her(And long time boyfriend left her about one year ago). She is trying to start personal training business. And having trouble exercising due to the above.    Pt states she can hear crepitus in her lower back. States no pain but the crepitus she hears is a concern.    No radicular pain described or weakness. No in continence.  Occasional throbbing pain in toe which she thinks is maybe gout. But that is not occuring presently.       Review of Systems  Constitutional: Negative for fever, chills and fatigue.  Respiratory: Negative for cough, chest tightness and wheezing.   Cardiovascular: Negative for chest pain and palpitations.  Gastrointestinal: Negative for nausea, vomiting and abdominal pain.  Genitourinary: Negative for dysuria, urgency, hematuria and flank pain.  Musculoskeletal: Negative for back pain.       Lumbar region crepitus. Occasional pain but none acutely.  Neurological: Negative for weakness and numbness.  Psychiatric/Behavioral: Positive for dysphoric mood. Negative for suicidal ideas, hallucinations, behavioral problems, confusion, sleep disturbance, self-injury and agitation. The patient is nervous/anxious.     Past Medical History  Diagnosis Date  . Heart murmur   . Hyperlipidemia   . Incontinence of urine   . Anemia   . Anxiety   . Depression     History   Social History  . Marital Status: Single    Spouse Name: N/A  . Number of Children: N/A  . Years of Education: N/A   Occupational History  . Not on file.   Social History Main Topics  . Smoking status: Never Smoker   . Smokeless tobacco: Never Used  . Alcohol Use: No  . Drug Use: No  . Sexual Activity: Not on file   Other Topics Concern  . Not on file   Social History Narrative    Past Surgical History  Procedure Laterality Date  . Tubal ligation    . Wisdom tooth extraction      Family History  Problem Relation Age of Onset  . Diabetes Mother   . Cancer Mother     colon cancer and breast cancer  . Depression Mother     Manic depression  . Heart disease Father   . Hypertension Father   . Cancer Maternal Grandmother     ovarian cancer  . Stomach cancer Paternal Aunt   . Colon cancer Neg Hx   . Esophageal cancer Neg Hx   . Rectal cancer Neg Hx     No Known Allergies  Current Outpatient Prescriptions on File Prior to Visit  Medication Sig Dispense Refill  . calcium carbonate (TUMS - DOSED  IN MG ELEMENTAL CALCIUM) 500 MG chewable tablet Chew 1 tablet by mouth as needed.    . cholecalciferol (VITAMIN D) 1000 UNITS tablet Take 1,000 Units by mouth daily.    . Cyanocobalamin (VITAMIN B 12) 100 MCG LOZG Take 1,000 mg by mouth daily.    . fish oil-omega-3 fatty acids 1000 MG capsule Take 1 g by mouth 3 (three) times daily.    . Multiple Vitamin (MULTIVITAMIN) capsule Take 1 capsule by mouth daily.     No current facility-administered medications on file prior to visit.    BP 143/84 mmHg  Pulse 59  Temp(Src) 98 F (36.7 C) (Oral)  Ht 5' 4.75" (1.645 m)  Wt 148 lb 3.2 oz (67.223 kg)  BMI 24.84 kg/m2  SpO2 96%  LMP 06/02/2014      Objective:   Physical Exam  General Appearance- Not in acute  distress.    Chest and Lung Exam Auscultation: Breath sounds:-Normal. Clear even and unlabored. Adventitious sounds:- No Adventitious sounds.  Cardiovascular Auscultation:Rythm - Regular, rate and rythm. Heart Sounds -Normal heart sounds.  Abdomen Inspection:-Inspection Normal.  Palpation/Perucssion: Palpation and Percussion of the abdomen reveal- Non Tender, No Rebound tenderness, No rigidity(Guarding) and No Palpable abdominal masses.  Liver:-Normal.  Spleen:- Normal.   Back Mid lumbar no  spine tenderness to palpation. Very faint transient pain on rt side straight leg lift Pain radiated from lower back to rt buttox region. No pain on left side leg raise. Pain on lateral movements and flexion/extension of the spine.(But during movement pt states can hear crepitus. This was not obvious to me.  Lower ext neurologic  L5-S1 sensation intact bilaterally. Normal patellar reflexes bilaterally. No foot drop bilaterally.     Assessment & Plan:

## 2014-09-07 ENCOUNTER — Telehealth: Payer: Self-pay | Admitting: Family Medicine

## 2014-09-07 NOTE — Telephone Encounter (Signed)
Caller name: Ameera, Tigue Relation to pt: self  Call back number: [phone]   Reason for call:   Pt expericing soreness in both breast and felt a lump in right breast. Requesting a script for [facility].

## 2014-09-07 NOTE — Telephone Encounter (Signed)
Doesn't pt need to be evaluated at our office first.

## 2014-09-07 NOTE — Telephone Encounter (Signed)
Pt needs OV prior to being able to order imaging.  Ok to use same day if needed

## 2014-09-08 NOTE — Telephone Encounter (Signed)
Called pt (a lot of background noise), pt will call back.

## 2014-10-12 ENCOUNTER — Telehealth: Payer: Self-pay | Admitting: Family Medicine

## 2014-10-12 NOTE — Telephone Encounter (Signed)
Phone keeps saying "cannot be completed as dialed". Have tried calling 336 first and w/o 336. Will not connect either way. Will try again later.

## 2014-10-12 NOTE — Telephone Encounter (Signed)
Left detailed message informing patient of this. °

## 2014-10-12 NOTE — Telephone Encounter (Signed)
Pt needs an appt for evaluation.

## 2014-10-12 NOTE — Telephone Encounter (Signed)
Caller name: [name] Relation to pt: self Call back number: [phone] ok to leave detailed message Pharmacy:  Reason for call:   Patient states that the rash that Dr. Birdie Riddle looked at last year never went away and is wanting to know if she needs to be referred to a dermatologist.

## 2014-10-18 ENCOUNTER — Encounter: Payer: Self-pay | Admitting: Physician Assistant

## 2014-10-18 ENCOUNTER — Ambulatory Visit (INDEPENDENT_AMBULATORY_CARE_PROVIDER_SITE_OTHER): Payer: BLUE CROSS/BLUE SHIELD | Admitting: Physician Assistant

## 2014-10-18 VITALS — BP 119/79 | HR 61 | Temp 98.3°F | Resp 16 | Ht 64.75 in | Wt 147.1 lb

## 2014-10-18 DIAGNOSIS — L309 Dermatitis, unspecified: Secondary | ICD-10-CM | POA: Diagnosis not present

## 2014-10-18 MED ORDER — CLOBETASOL PROPIONATE 0.05 % EX OINT: 1.0000 "application " | TOPICAL_OINTMENT | Freq: Two times a day (BID) | CUTANEOUS | Status: DC

## 2014-10-18 NOTE — Assessment & Plan Note (Signed)
Rx Clobetasol ointment for flare -- BID x 2 weeks then PRN. Supportive measures review.  If not improving, patient to call office so referral to dermatology can be placed.

## 2014-10-18 NOTE — Progress Notes (Signed)
Pre visit review using our clinic review tool, if applicable. No additional management support is needed unless otherwise documented below in the visit note/SLS  

## 2014-10-18 NOTE — Progress Notes (Signed)
   Patient presents to clinic today c/o continued eczema symptoms of flexural surfaces of arms.  Was given Triamcinolone cream by PCP 1 year ago for eczema.  Denies sick contact with similar rash. Patient has restarted her cream over the past few weeks with only mild improvement.  Past Medical History  Diagnosis Date  . Heart murmur   . Hyperlipidemia   . Incontinence of urine   . Anemia   . Anxiety   . Depression     Current Outpatient Prescriptions on File Prior to Visit  Medication Sig Dispense Refill  . calcium carbonate (TUMS - DOSED IN MG ELEMENTAL CALCIUM) 500 MG chewable tablet Chew 1 tablet by mouth as needed.    . cholecalciferol (VITAMIN D) 1000 UNITS tablet Take 1,000 Units by mouth daily.    . Cyanocobalamin (VITAMIN B 12) 100 MCG LOZG Take 1,000 mg by mouth daily.    . fish oil-omega-3 fatty acids 1000 MG capsule Take 1 g by mouth 3 (three) times daily.    . Multiple Vitamin (MULTIVITAMIN) capsule Take 1 capsule by mouth daily.     No current facility-administered medications on file prior to visit.    No Known Allergies  Family History  Problem Relation Age of Onset  . Diabetes Mother   . Cancer Mother     colon cancer and breast cancer  . Depression Mother     Manic depression  . Heart disease Father   . Hypertension Father   . Cancer Maternal Grandmother     ovarian cancer  . Stomach cancer Paternal Aunt   . Colon cancer Neg Hx   . Esophageal cancer Neg Hx   . Rectal cancer Neg Hx     History   Social History  . Marital Status: Single    Spouse Name: N/A  . Number of Children: N/A  . Years of Education: N/A   Social History Main Topics  . Smoking status: Never Smoker   . Smokeless tobacco: Never Used  . Alcohol Use: No  . Drug Use: No  . Sexual Activity: Not on file   Other Topics Concern  . None   Social History Narrative   Review of Systems - See HPI.  All other ROS are negative.  BP 119/79 mmHg  Pulse 61  Temp(Src) 98.3 F (36.8  C) (Oral)  Resp 16  Ht 5' 4.75" (1.645 m)  Wt 147 lb 2 oz (66.735 kg)  BMI 24.66 kg/m2  SpO2 98%  LMP 09/10/2014  Physical Exam  Constitutional: She is oriented to person, place, and time.  Cardiovascular: Normal rate, regular rhythm, normal heart sounds and intact distal pulses.   Pulmonary/Chest: Effort normal. No respiratory distress.  Neurological: She is alert and oriented to person, place, and time.  Skin: Skin is warm and dry.     Vitals reviewed.    Assessment/Plan: Eczema Rx Clobetasol ointment for flare -- BID x 2 weeks then PRN. Supportive measures review.  If not improving, patient to call office so referral to dermatology can be placed.

## 2014-10-18 NOTE — Patient Instructions (Signed)
Please use Clobetasol cream twice daily as directed for 2 weeks.  Then decrease to once daily or as needed.  Make sure to at least apply one of the doses after you get out of the shower as it will absorb better.  Continue moisturizing lotion (Euceriv, Aquaphor or Aveeno).   If symptoms are not improving, call me so I can set you up with a dermatologist.

## 2014-10-20 ENCOUNTER — Telehealth: Payer: Self-pay | Admitting: Family Medicine

## 2014-10-20 NOTE — Telephone Encounter (Signed)
Pre Visit letter sent  °

## 2014-10-25 ENCOUNTER — Other Ambulatory Visit: Payer: Self-pay | Admitting: Gynecology

## 2014-11-09 ENCOUNTER — Telehealth: Payer: Self-pay | Admitting: *Deleted

## 2014-11-09 NOTE — Telephone Encounter (Signed)
Unable to reach patient at time of Pre-Visit Call.  Left message to confirm appointment.

## 2014-11-10 ENCOUNTER — Encounter: Payer: Self-pay | Admitting: Family Medicine

## 2014-11-10 ENCOUNTER — Ambulatory Visit (INDEPENDENT_AMBULATORY_CARE_PROVIDER_SITE_OTHER): Payer: BLUE CROSS/BLUE SHIELD | Admitting: Family Medicine

## 2014-11-10 ENCOUNTER — Encounter: Payer: BLUE CROSS/BLUE SHIELD | Admitting: Family Medicine

## 2014-11-10 VITALS — BP 108/72 | HR 60 | Temp 97.9°F | Resp 16 | Ht 65.0 in | Wt 148.5 lb

## 2014-11-10 DIAGNOSIS — E010 Iodine-deficiency related diffuse (endemic) goiter: Secondary | ICD-10-CM

## 2014-11-10 DIAGNOSIS — E049 Nontoxic goiter, unspecified: Secondary | ICD-10-CM | POA: Diagnosis not present

## 2014-11-10 DIAGNOSIS — Z0000 Encounter for general adult medical examination without abnormal findings: Secondary | ICD-10-CM | POA: Diagnosis not present

## 2014-11-10 NOTE — Progress Notes (Signed)
Pre visit review using our clinic review tool, if applicable. No additional management support is needed unless otherwise documented below in the visit note. 

## 2014-11-10 NOTE — Progress Notes (Signed)
   Subjective:    Patient ID: [name], female    DOB: October 18, 1960, 54 y.o.   MRN: [id number]  HPI CPE- UTD on GYN ([facility])   Review of Systems Patient reports no vision/ hearing changes, adenopathy,fever, weight change,  persistant/recurrent hoarseness , swallowing issues, chest pain, palpitations, edema, persistant/recurrent cough, hemoptysis, dyspnea (rest/exertional/paroxysmal nocturnal), gastrointestinal bleeding (melena, rectal bleeding), abdominal pain, significant heartburn, bowel changes, GU symptoms (dysuria, hematuria, incontinence), Gyn symptoms (abnormal  bleeding, pain),  syncope, focal weakness, memory loss, numbness & tingling, skin/hair/nail changes, abnormal bruising or bleeding, anxiety, or depression.     Objective:   Physical Exam General Appearance:    Alert, cooperative, no distress, appears stated age  Head:    Normocephalic, without obvious abnormality, atraumatic  Eyes:    PERRL, conjunctiva/corneas clear, EOM's intact, fundi    benign, both eyes  Ears:    Normal TM's and external ear canals, both ears  Nose:   Nares normal, septum midline, mucosa normal, no drainage    or sinus tenderness  Throat:   Lips, mucosa, and tongue normal; teeth and gums normal  Neck:   Supple, symmetrical, trachea midline, no adenopathy;    Thyroid: L sided enlargement  Back:     Symmetric, no curvature, ROM normal, no CVA tenderness  Lungs:     Clear to auscultation bilaterally, respirations unlabored  Chest Wall:    No tenderness or deformity   Heart:    Regular rate and rhythm, S1 and S2 normal, no murmur, rub   or gallop  Breast Exam:    Deferred to GYN  Abdomen:     Soft, non-tender, bowel sounds active all four quadrants,    no masses, no organomegaly  Genitalia:    Deferred to GYN  Rectal:    Extremities:   Extremities normal, atraumatic, no cyanosis or edema  Pulses:   2+ and symmetric all extremities  Skin:   Skin color, texture, turgor normal, no rashes or lesions    Lymph nodes:   Cervical, supraclavicular, and axillary nodes normal  Neurologic:   CNII-XII intact, normal strength, sensation and reflexes    throughout          Assessment & Plan:

## 2014-11-10 NOTE — Patient Instructions (Signed)
Follow up in 1 year or as needed We'll notify you of your lab results and make any changes if needed Keep up the good work on healthy diet and regular exercise- you look great! We'll call you with your thyroid ultrasound appt Call with any questions or concerns Happy 4th of July! Congrats on the wedding!!!

## 2014-11-10 NOTE — Assessment & Plan Note (Signed)
New.  Check TSH.  Get [place] to assess.  Pt expressed understanding and is in agreement w/ plan.

## 2014-11-10 NOTE — Assessment & Plan Note (Signed)
Pt's PE WNL w/ exception of new L sided thyromegaly.  Has appt upcoming w/ Dr Avanell Shackleton (GYN).  Check labs.  Anticipatory guidance provided.

## 2014-11-17 ENCOUNTER — Ambulatory Visit (HOSPITAL_BASED_OUTPATIENT_CLINIC_OR_DEPARTMENT_OTHER)
Admission: RE | Admit: 2014-11-17 | Discharge: 2014-11-17 | Disposition: A | Discharge status: Home / Self Care | Payer: BLUE CROSS/BLUE SHIELD | Source: Ambulatory Visit | Attending: Family Medicine | Admitting: Family Medicine

## 2014-11-17 ENCOUNTER — Other Ambulatory Visit: Payer: BLUE CROSS/BLUE SHIELD

## 2014-11-17 DIAGNOSIS — E010 Iodine-deficiency related diffuse (endemic) goiter: Secondary | ICD-10-CM | POA: Diagnosis present

## 2014-11-17 DIAGNOSIS — E041 Nontoxic single thyroid nodule: Secondary | ICD-10-CM | POA: Insufficient documentation

## 2014-11-20 ENCOUNTER — Telehealth: Payer: Self-pay | Admitting: Family Medicine

## 2014-11-20 NOTE — Telephone Encounter (Signed)
Patient returned phone call. °

## 2014-11-20 NOTE — Telephone Encounter (Signed)
Called pt and LMVOM

## 2014-11-20 NOTE — Telephone Encounter (Signed)
Relation to pt: self  Call back number: [phone] Pharmacy:   Reason for call:  Pt inquiring about lab results taken 11/10/2014

## 2014-11-21 ENCOUNTER — Other Ambulatory Visit: Payer: Self-pay | Admitting: Family Medicine

## 2014-11-21 DIAGNOSIS — E041 Nontoxic single thyroid nodule: Secondary | ICD-10-CM

## 2014-11-21 NOTE — Telephone Encounter (Signed)
Relation to pt: self  Call back number: [phone]  Reason for call:  Pt returning your call advised please leave VM due to patient being at work.

## 2014-11-21 NOTE — Telephone Encounter (Signed)
Pt notified of lab results

## 2014-11-21 NOTE — Progress Notes (Signed)
Pt has an appt 7/15 to have labs drawn.

## 2014-11-24 ENCOUNTER — Encounter: Payer: Self-pay | Admitting: General Practice

## 2014-11-24 ENCOUNTER — Telehealth: Payer: Self-pay | Admitting: Family Medicine

## 2014-11-24 ENCOUNTER — Encounter: Payer: Self-pay | Admitting: Family Medicine

## 2014-11-24 ENCOUNTER — Other Ambulatory Visit (INDEPENDENT_AMBULATORY_CARE_PROVIDER_SITE_OTHER): Payer: BLUE CROSS/BLUE SHIELD

## 2014-11-24 DIAGNOSIS — Z0000 Encounter for general adult medical examination without abnormal findings: Secondary | ICD-10-CM | POA: Diagnosis not present

## 2014-11-24 LAB — BASIC METABOLIC PANEL
BUN: 20 mg/dL (ref 6–23)
CALCIUM: 8.9 mg/dL (ref 8.4–10.5)
CO2: 24 meq/L (ref 19–32)
Chloride: 107 mEq/L (ref 96–112)
Creatinine, Ser: 1.08 mg/dL (ref 0.40–1.20)
GFR: 56.56 mL/min — AB (ref >60.00)
Glucose, Bld: 73 mg/dL (ref 70–99)
Potassium: 3.8 mEq/L (ref 3.5–5.1)
Sodium: 138 mEq/L (ref 135–145)

## 2014-11-24 LAB — CBC WITH DIFFERENTIAL/PLATELET
Basophils Absolute: 0 10*3/uL (ref 0.0–0.1)
Basophils Relative: 0.5 % (ref 0.0–3.0)
EOS ABS: 0.4 10*3/uL (ref 0.0–0.7)
Eosinophils Relative: 6.9 % — ABNORMAL HIGH (ref 0.0–5.0)
HCT: 41.1 % (ref 36.0–46.0)
HEMOGLOBIN: 13.5 g/dL (ref 12.0–15.0)
Lymphocytes Relative: 22.1 % (ref 12.0–46.0)
Lymphs Abs: 1.3 10*3/uL (ref 0.7–4.0)
MCHC: 32.8 g/dL (ref 30.0–36.0)
MCV: 90.9 fl (ref 78.0–100.0)
MONOS PCT: 9.2 % (ref 3.0–12.0)
Monocytes Absolute: 0.5 10*3/uL (ref 0.1–1.0)
Neutro Abs: 3.6 10*3/uL (ref 1.4–7.7)
Neutrophils Relative %: 61.3 % (ref 43.0–77.0)
Platelets: 185 10*3/uL (ref 150.0–400.0)
RBC: 4.52 Mil/uL (ref 3.87–5.11)
RDW: 13.6 % (ref 11.5–15.5)
WBC: 5.9 10*3/uL (ref 4.0–10.5)

## 2014-11-24 LAB — HEPATIC FUNCTION PANEL
ALBUMIN: 3.9 g/dL (ref 3.5–5.2)
ALK PHOS: 55 U/L (ref 39–117)
ALT: 22 U/L (ref 0–35)
AST: 27 U/L (ref 0–37)
BILIRUBIN TOTAL: 0.3 mg/dL (ref 0.2–1.2)
Bilirubin, Direct: 0.1 mg/dL (ref 0.0–0.3)
Total Protein: 6.3 g/dL (ref 6.0–8.3)

## 2014-11-24 LAB — LIPID PANEL
Cholesterol: 151 mg/dL (ref 0–200)
HDL: 54 mg/dL (ref >39.00)
LDL Cholesterol: 86 mg/dL (ref 0–99)
NonHDL: 97
Total CHOL/HDL Ratio: 3
Triglycerides: 53 mg/dL (ref 0.0–149.0)
VLDL: 10.6 mg/dL (ref 0.0–40.0)

## 2014-11-24 LAB — TSH: TSH: 1.37 u[IU]/mL (ref 0.35–4.50)

## 2014-11-24 LAB — VITAMIN D 25 HYDROXY (VIT D DEFICIENCY, FRACTURES): VITD: 52.68 ng/mL (ref 30.00–100.00)

## 2014-11-24 NOTE — Telephone Encounter (Signed)
Caller name: Tesa Relation to pt: self  Call back number: [phone] after 2:30 pm Pharmacy:  Reason for call: Pt came in office to have labs done 11-24-14, pt states had an ultrasound done 11-17-14 and states is needing to have a biopsy done. Please advise.

## 2014-11-24 NOTE — Telephone Encounter (Signed)
Called pt and left a detailed message to inform that we placed the ENT referral for Biopsy on 11/21/14, per EPIC this is in their cue to schedule.

## 2014-11-27 ENCOUNTER — Telehealth: Payer: Self-pay | Admitting: Family Medicine

## 2014-11-27 NOTE — Telephone Encounter (Signed)
Relation to pt: self  Call back number: [phone]   Reason for call:  Patient inquiring about lab results taken 11/24/2014

## 2014-11-27 NOTE — Telephone Encounter (Signed)
Pt notified of results

## 2014-12-25 ENCOUNTER — Encounter: Payer: Self-pay | Admitting: Medical

## 2014-12-25 ENCOUNTER — Ambulatory Visit (INDEPENDENT_AMBULATORY_CARE_PROVIDER_SITE_OTHER): Payer: BLUE CROSS/BLUE SHIELD | Admitting: Medical

## 2014-12-25 VITALS — BP 125/68 | HR 57 | Temp 98.0°F | Ht 65.0 in | Wt 144.4 lb

## 2014-12-25 DIAGNOSIS — N390 Urinary tract infection, site not specified: Secondary | ICD-10-CM | POA: Diagnosis not present

## 2014-12-25 DIAGNOSIS — R319 Hematuria, unspecified: Secondary | ICD-10-CM | POA: Diagnosis not present

## 2014-12-25 DIAGNOSIS — R350 Frequency of micturition: Secondary | ICD-10-CM

## 2014-12-25 LAB — POCT URINALYSIS DIPSTICK
Bilirubin, UA: NEGATIVE
Glucose, UA: NEGATIVE
Ketones, UA: NEGATIVE
NITRITE UA: NEGATIVE
Protein, UA: 15
RBC UA: POSITIVE
Spec Grav, UA: >=1.03
UROBILINOGEN UA: 0.2
pH, UA: 5

## 2014-12-25 MED ORDER — CIPROFLOXACIN HCL 500 MG PO TABS: 500.0000 mg | ORAL_TABLET | Freq: Two times a day (BID) | ORAL | Status: DC

## 2014-12-25 NOTE — Progress Notes (Signed)
Subjective:    Patient ID: [name], female    DOB: March 25, 1961, 54 y.o.   MRN: [id number]  HPI  Pt in with some recent dysuria(on Friday last week was urinating more frequent). Last night she had some pain. She states could see blood when she urinated. She drank a lot of water last night and the urine looked clear this afternoon.Pt states gets rare occasional uti in the past. Pt remembers remote hx of e coli in urine one time.  Pt has very remote hx of smoking when 77-19 yo. But some second hand smoke from husband.  No hx of known blood in urine except when she had infections.     Review of Systems  Constitutional: Negative for fever, chills and fatigue.  Gastrointestinal: Negative for nausea, vomiting, abdominal pain and diarrhea.  Genitourinary: Positive for dysuria and frequency. Negative for decreased urine volume, vaginal bleeding and vaginal pain.  Musculoskeletal: Negative for back pain.  Neurological: Negative for dizziness, weakness and headaches.  Hematological: Negative for adenopathy. Does not bruise/bleed easily.    Past Medical History  Diagnosis Date  . Heart murmur   . Hyperlipidemia   . Incontinence of urine   . Anemia   . Anxiety   . Depression     Social History   Social History  . Marital Status: Single    Spouse Name: N/A  . Number of Children: N/A  . Years of Education: N/A   Occupational History  . Not on file.   Social History Main Topics  . Smoking status: Never Smoker   . Smokeless tobacco: Never Used  . Alcohol Use: No  . Drug Use: No  . Sexual Activity: Not on file   Other Topics Concern  . Not on file   Social History Narrative    Past Surgical History  Procedure Laterality Date  . Tubal ligation    . Wisdom tooth extraction      Family History  Problem Relation Age of Onset  . Diabetes Mother   . Cancer Mother     colon cancer and breast cancer  . Depression Mother     Manic depression  . Heart disease Father     . Hypertension Father   . Cancer Maternal Grandmother     ovarian cancer  . Stomach cancer Paternal Aunt   . Colon cancer Neg Hx   . Esophageal cancer Neg Hx   . Rectal cancer Neg Hx     No Known Allergies  Current Outpatient Prescriptions on File Prior to Visit  Medication Sig Dispense Refill  . [place]-Glucosamine-Vit D (GLUCOSAMINE COMPLEX PO) Take by mouth.    . cholecalciferol (VITAMIN D) 1000 UNITS tablet Take 1,000 Units by mouth daily.    . Cyanocobalamin (VITAMIN B 12) 100 MCG LOZG Take 1,000 mg by mouth daily.    Nyoka Cowden Tea, Camillia sinensis, (GREEN TEA PO) Take by mouth.    . Multiple Vitamin (MULTIVITAMIN) capsule Take 1 capsule by mouth daily.    . calcium carbonate (TUMS - DOSED IN MG ELEMENTAL CALCIUM) 500 MG chewable tablet Chew 1 tablet by mouth as needed.    . clobetasol ointment (TEMOVATE) 7.67 % Apply 1 application topically 2 (two) times daily. (Patient not taking: Reported on 12/25/2014) 30 g 0   No current facility-administered medications on file prior to visit.    BP 125/68 mmHg  Pulse 57  Temp(Src) 98 F (36.7 C) (Oral)  Ht 5\' 5"  (1.651 m)  Wt 144  lb 6.4 oz (65.499 kg)  BMI 24.03 kg/m2  SpO2 97%  LMP 12/11/2014       Objective:   Physical Exam  General Appearance- Not in acute distress.  HEENT Eyes- Scleraeral/Conjuntiva-bilat- Not Yellow. Mouth & Throat- Normal.  Chest and Lung Exam Auscultation: Breath sounds:-Normal. Adventitious sounds:- No Adventitious sounds.  Cardiovascular Auscultation:Rythm - Regular. Heart Sounds -Normal heart sounds.  Abdomen Inspection:-Inspection Normal.  Palpation/Perucssion: Palpation and Percussion of the abdomen reveal- Non Tender suprapubic area, No Rebound tenderness, No rigidity(Guarding) and No Palpable abdominal masses.  Liver:-Normal.  Spleen:- Normal.   Back- no cva tenderness.       Assessment & Plan:  You appear to have a urinary tract infection. I am prescribing cipro  antibiotic for the probable infection. Hydrate well. I am sending out a urine culture. During the interim if your signs and symptoms worsen rather than improving please notify us. We will notify your when the culture results are back.  Since you described obvious blood in urine. I do want you to follow up in 10 days to repeat urine sample to see if any residual blood present.(important to follow up).  Follow up in 10 days or as needed.

## 2014-12-25 NOTE — Progress Notes (Signed)
Pre visit review using our clinic review tool, if applicable. No additional management support is needed unless otherwise documented below in the visit note. 

## 2014-12-25 NOTE — Patient Instructions (Addendum)
You appear to have a urinary tract infection. I am prescribing cipro antibiotic for the probable infection. Hydrate well. I am sending out a urine culture. During the interim if your signs and symptoms worsen rather than improving please notify us. We will notify your when the culture results are back.  Since you described obvious blood in urine. I do want you to follow up in 10 days to repeat urine sample to see if any residual blood present.(important to follow up).  Follow up in 10 days or as needed.

## 2014-12-27 LAB — URINE CULTURE

## 2014-12-28 ENCOUNTER — Telehealth: Payer: Self-pay | Admitting: Family Medicine

## 2014-12-28 NOTE — Telephone Encounter (Signed)
Called patient Left message for call back

## 2014-12-28 NOTE — Telephone Encounter (Signed)
Relation to pt: self  Call back number: [phone]   Reason for call:  Patient inquiring about lab results taken 12/25/14 with Percell Miller. Please call after 5pm

## 2014-12-29 NOTE — Telephone Encounter (Signed)
Spoke with patient regarding lab results. 

## 2015-01-05 ENCOUNTER — Other Ambulatory Visit (HOSPITAL_COMMUNITY)
Admission: RE | Admit: 2015-01-05 | Discharge: 2015-01-05 | Disposition: A | Discharge status: Home / Self Care | Payer: BLUE CROSS/BLUE SHIELD | Source: Ambulatory Visit | Attending: Otolaryngology | Admitting: Otolaryngology

## 2015-01-05 ENCOUNTER — Other Ambulatory Visit: Payer: Self-pay | Admitting: Otolaryngology

## 2015-01-05 DIAGNOSIS — E041 Nontoxic single thyroid nodule: Secondary | ICD-10-CM | POA: Diagnosis not present

## 2015-01-09 ENCOUNTER — Other Ambulatory Visit: Payer: Self-pay

## 2015-01-09 DIAGNOSIS — Z1231 Encounter for screening mammogram for malignant neoplasm of breast: Secondary | ICD-10-CM

## 2015-01-17 ENCOUNTER — Other Ambulatory Visit: Payer: Self-pay | Admitting: Otolaryngology

## 2015-01-17 DIAGNOSIS — E041 Nontoxic single thyroid nodule: Secondary | ICD-10-CM

## 2015-01-19 ENCOUNTER — Ambulatory Visit
Admission: RE | Admit: 2015-01-19 | Discharge: 2015-01-19 | Disposition: A | Discharge status: Home / Self Care | Payer: BLUE CROSS/BLUE SHIELD | Source: Ambulatory Visit

## 2015-01-19 DIAGNOSIS — Z1231 Encounter for screening mammogram for malignant neoplasm of breast: Secondary | ICD-10-CM

## 2015-02-15 ENCOUNTER — Ambulatory Visit
Admission: RE | Admit: 2015-02-15 | Discharge: 2015-02-15 | Disposition: A | Discharge status: Home / Self Care | Payer: BLUE CROSS/BLUE SHIELD | Source: Ambulatory Visit | Attending: Otolaryngology | Admitting: Otolaryngology

## 2015-02-15 ENCOUNTER — Other Ambulatory Visit (HOSPITAL_COMMUNITY)
Admission: RE | Admit: 2015-02-15 | Discharge: 2015-02-15 | Disposition: A | Discharge status: Home / Self Care | Payer: BLUE CROSS/BLUE SHIELD | Source: Ambulatory Visit | Attending: Interventional Radiology | Admitting: Interventional Radiology

## 2015-02-15 DIAGNOSIS — E041 Nontoxic single thyroid nodule: Secondary | ICD-10-CM | POA: Diagnosis not present

## 2015-03-01 ENCOUNTER — Other Ambulatory Visit (HOSPITAL_COMMUNITY): Payer: Self-pay | Admitting: Otolaryngology

## 2015-03-02 ENCOUNTER — Ambulatory Visit (HOSPITAL_COMMUNITY)
Admission: RE | Admit: 2015-03-02 | Discharge: 2015-03-02 | Disposition: A | Discharge status: Home / Self Care | Payer: BLUE CROSS/BLUE SHIELD | Source: Ambulatory Visit | Attending: Anesthesiology | Admitting: Anesthesiology

## 2015-03-02 ENCOUNTER — Encounter (HOSPITAL_COMMUNITY)
Admission: RE | Admit: 2015-03-02 | Discharge: 2015-03-02 | Disposition: A | Discharge status: Home / Self Care | Payer: BLUE CROSS/BLUE SHIELD | Source: Ambulatory Visit | Attending: Otolaryngology | Admitting: Otolaryngology

## 2015-03-02 ENCOUNTER — Encounter (HOSPITAL_COMMUNITY): Payer: Self-pay

## 2015-03-02 DIAGNOSIS — E079 Disorder of thyroid, unspecified: Secondary | ICD-10-CM | POA: Insufficient documentation

## 2015-03-02 DIAGNOSIS — Z01812 Encounter for preprocedural laboratory examination: Secondary | ICD-10-CM | POA: Insufficient documentation

## 2015-03-02 DIAGNOSIS — Z01818 Encounter for other preprocedural examination: Secondary | ICD-10-CM | POA: Diagnosis not present

## 2015-03-02 HISTORY — DX: Unspecified osteoarthritis, unspecified site: M19.90

## 2015-03-02 HISTORY — DX: Gastro-esophageal reflux disease without esophagitis: K21.9

## 2015-03-02 LAB — BASIC METABOLIC PANEL
ANION GAP: 11 (ref 5–15)
BUN: 12 mg/dL (ref 6–20)
CALCIUM: 8.9 mg/dL (ref 8.9–10.3)
CO2: 23 mmol/L (ref 22–32)
Chloride: 105 mmol/L (ref 101–111)
Creatinine, Ser: 0.89 mg/dL (ref 0.44–1.00)
GFR calc Af Amer: >60 mL/min (ref >60)
GFR calc non Af Amer: >60 mL/min (ref >60)
GLUCOSE: 98 mg/dL (ref 65–99)
Potassium: 3.6 mmol/L (ref 3.5–5.1)
Sodium: 139 mmol/L (ref 135–145)

## 2015-03-02 LAB — CBC
HCT: 36.8 % (ref 36.0–46.0)
HEMOGLOBIN: 12.5 g/dL (ref 12.0–15.0)
MCH: 30 pg (ref 26.0–34.0)
MCHC: 34 g/dL (ref 30.0–36.0)
MCV: 88.2 fL (ref 78.0–100.0)
Platelets: 260 10*3/uL (ref 150–400)
RBC: 4.17 MIL/uL (ref 3.87–5.11)
RDW: 12.5 % (ref 11.5–15.5)
WBC: 6.9 10*3/uL (ref 4.0–10.5)

## 2015-03-02 NOTE — Pre-Procedure Instructions (Addendum)
[name]  03/02/2015      [facility] - [place], [place] - [place] [address] Phone: [phone] Fax: [phone]    Your procedure is scheduled on *03/09/15  Report to [facility] short stay admitting at 900 A.M.  Call this number if you have problems the morning of surgery:  [phone]   Remember:  Do not eat food or drink liquids after midnight.  Take these medicines the morning of surgery with A SIP OF WATER none   STOP all herbel meds, nsaids (aleve,naproxen,advil,ibuprofen) starting Today including naproxen, ibuprofen,calcium, vit D, vit B-12, green tea extract,glucosamine-chondroitin,multi vit, kre-alkalyn   Do not wear jewelry, make-up or nail polish.  Do not wear lotions, powders, or perfumes.  You may wear deodorant.  Do not shave 48 hours prior to surgery.  Men may shave face and neck.  Do not bring valuables to the hospital.  [facility] is not responsible for any belongings or valuables.  Contacts, dentures or bridgework may not be worn into surgery.  Leave your suitcase in the car.  After surgery it may be brought to your room.  For patients admitted to the hospital, discharge time will be determined by your treatment team.  Patients discharged the day of surgery will not be allowed to drive home.   Name and phone number of your driver:    Special instructions:   Special Instructions: [facility] - Preparing for Surgery  Before surgery, you can play an important role.  Because skin is not sterile, your skin needs to be as free of germs as possible.  You can reduce the number of germs on you skin by washing with CHG (chlorahexidine gluconate) soap before surgery.  CHG is an antiseptic cleaner which kills germs and bonds with the skin to continue killing germs even after washing.  Please DO NOT use if you have an allergy to CHG or antibacterial soaps.  If your skin becomes  reddened/irritated stop using the CHG and inform your nurse when you arrive at Short Stay.  Do not shave (including legs and underarms) for at least 48 hours prior to the first CHG shower.  You may shave your face.  Please follow these instructions carefully:   1.  Shower with CHG Soap the night before surgery and the morning of Surgery.  2.  If you choose to wash your hair, wash your hair first as usual with your normal shampoo.  3.  After you shampoo, rinse your hair and body thoroughly to remove the Shampoo.  4.  Use CHG as you would any other liquid soap.  You can apply chg directly  to the skin and wash gently with scrungie or a clean washcloth.  5.  Apply the CHG Soap to your body ONLY FROM THE NECK DOWN.  Do not use on open wounds or open sores.  Avoid contact with your eyes ears, mouth and genitals (private parts).  Wash genitals (private parts)       with your normal soap.  6.  Wash thoroughly, paying special attention to the area where your surgery will be performed.  7.  Thoroughly rinse your body with warm water from the neck down.  8.  DO NOT shower/wash with your normal soap after using and rinsing off the CHG Soap.  9.  Pat yourself dry with a clean towel.            10.  Wear clean pajamas.  11.  Place clean sheets on your bed the night of your first shower and do not sleep with pets.  Day of Surgery  Do not apply any lotions/deodorants the morning of surgery.  Please wear clean clothes to the hospital/surgery center.  Please read over the following fact sheets that you were given. Pain Booklet, Coughing and Deep Breathing and Surgical Site Infection Prevention

## 2015-03-05 ENCOUNTER — Other Ambulatory Visit (HOSPITAL_COMMUNITY): Payer: BLUE CROSS/BLUE SHIELD

## 2015-03-08 NOTE — Progress Notes (Signed)
Pt called back to verify that she would arrive at 0730 for time change, surgery to start at 0930.

## 2015-03-09 ENCOUNTER — Ambulatory Visit (HOSPITAL_COMMUNITY): Payer: BLUE CROSS/BLUE SHIELD | Admitting: Anesthesiology

## 2015-03-09 ENCOUNTER — Observation Stay (HOSPITAL_COMMUNITY)
Admission: RE | Admit: 2015-03-09 | Discharge: 2015-03-10 | Disposition: A | Discharge status: Home / Self Care | Payer: BLUE CROSS/BLUE SHIELD | Source: Ambulatory Visit | Attending: Otolaryngology | Admitting: Otolaryngology

## 2015-03-09 ENCOUNTER — Encounter (HOSPITAL_COMMUNITY)
Admission: RE | Disposition: A | Discharge status: Home / Self Care | Payer: Self-pay | Source: Ambulatory Visit | Attending: Otolaryngology

## 2015-03-09 ENCOUNTER — Encounter (HOSPITAL_COMMUNITY): Payer: Self-pay | Admitting: *Deleted

## 2015-03-09 DIAGNOSIS — D34 Benign neoplasm of thyroid gland: Secondary | ICD-10-CM | POA: Diagnosis not present

## 2015-03-09 DIAGNOSIS — E785 Hyperlipidemia, unspecified: Secondary | ICD-10-CM | POA: Diagnosis not present

## 2015-03-09 DIAGNOSIS — E041 Nontoxic single thyroid nodule: Secondary | ICD-10-CM | POA: Diagnosis present

## 2015-03-09 HISTORY — PX: THYROIDECTOMY: SHX17

## 2015-03-09 LAB — HCG, SERUM, QUALITATIVE: Preg, Serum: NEGATIVE

## 2015-03-09 SURGERY — THYROIDECTOMY
Anesthesia: General | Site: Neck | Laterality: Left

## 2015-03-09 MED ORDER — ONDANSETRON HCL 4 MG/2ML IJ SOLN
INTRAMUSCULAR | Status: AC
  Filled 2015-03-09: qty 2

## 2015-03-09 MED ORDER — HYDROCODONE-ACETAMINOPHEN 5-325 MG PO TABS
1.0000 | ORAL_TABLET | ORAL | Status: DC | PRN
  Administered 2015-03-09 – 2015-03-10 (×2): 2 via ORAL
  Filled 2015-03-09 (×2): qty 2

## 2015-03-09 MED ORDER — GLYCOPYRROLATE 0.2 MG/ML IJ SOLN
INTRAMUSCULAR | Status: DC | PRN
  Administered 2015-03-09: 0.4 mg via INTRAVENOUS

## 2015-03-09 MED ORDER — MIDAZOLAM HCL 5 MG/5ML IJ SOLN
INTRAMUSCULAR | Status: DC | PRN
  Administered 2015-03-09: 2 mg via INTRAVENOUS

## 2015-03-09 MED ORDER — 0.9 % SODIUM CHLORIDE (POUR BTL) OPTIME
TOPICAL | Status: DC | PRN
  Administered 2015-03-09: 1000 mL

## 2015-03-09 MED ORDER — CEFAZOLIN SODIUM 1-5 GM-% IV SOLN
1.0000 g | Freq: Three times a day (TID) | INTRAVENOUS | Status: DC
  Administered 2015-03-09 – 2015-03-10 (×2): 1 g via INTRAVENOUS
  Filled 2015-03-09 (×3): qty 50

## 2015-03-09 MED ORDER — OXYCODONE HCL 5 MG PO TABS
5.0000 mg | ORAL_TABLET | Freq: Once | ORAL | Status: AC | PRN
  Administered 2015-03-09: 5 mg via ORAL

## 2015-03-09 MED ORDER — CEFAZOLIN SODIUM-DEXTROSE 2-3 GM-% IV SOLR
INTRAVENOUS | Status: DC | PRN
  Administered 2015-03-09: 2 g via INTRAVENOUS

## 2015-03-09 MED ORDER — KCL IN DEXTROSE-NACL 20-5-0.45 MEQ/L-%-% IV SOLN
INTRAVENOUS | Status: DC
  Administered 2015-03-09 – 2015-03-10 (×2): via INTRAVENOUS
  Filled 2015-03-09 (×2): qty 1000

## 2015-03-09 MED ORDER — PROPOFOL 10 MG/ML IV BOLUS
INTRAVENOUS | Status: AC
  Filled 2015-03-09: qty 20

## 2015-03-09 MED ORDER — PROMETHAZINE HCL 25 MG/ML IJ SOLN: 6.2500 mg | INTRAMUSCULAR | Status: DC | PRN

## 2015-03-09 MED ORDER — PROMETHAZINE HCL 25 MG RE SUPP: 12.5000 mg | Freq: Four times a day (QID) | RECTAL | Status: DC | PRN

## 2015-03-09 MED ORDER — FENTANYL CITRATE (PF) 100 MCG/2ML IJ SOLN
25.0000 ug | INTRAMUSCULAR | Status: DC | PRN
  Administered 2015-03-09 (×3): 25 ug via INTRAVENOUS

## 2015-03-09 MED ORDER — ARTIFICIAL TEARS OP OINT
TOPICAL_OINTMENT | OPHTHALMIC | Status: AC
  Filled 2015-03-09: qty 3.5

## 2015-03-09 MED ORDER — PROPOFOL 10 MG/ML IV BOLUS
INTRAVENOUS | Status: DC | PRN
  Administered 2015-03-09: 160 mg via INTRAVENOUS

## 2015-03-09 MED ORDER — DEXAMETHASONE SODIUM PHOSPHATE 4 MG/ML IJ SOLN
INTRAMUSCULAR | Status: DC | PRN
  Administered 2015-03-09: 4 mg via INTRAVENOUS

## 2015-03-09 MED ORDER — FENTANYL CITRATE (PF) 100 MCG/2ML IJ SOLN
INTRAMUSCULAR | Status: AC
  Administered 2015-03-09: 25 ug via INTRAVENOUS
  Filled 2015-03-09: qty 2

## 2015-03-09 MED ORDER — ROCURONIUM BROMIDE 100 MG/10ML IV SOLN
INTRAVENOUS | Status: DC | PRN
  Administered 2015-03-09: 10 mg via INTRAVENOUS
  Administered 2015-03-09: 20 mg via INTRAVENOUS

## 2015-03-09 MED ORDER — OXYCODONE HCL 5 MG/5ML PO SOLN: 5.0000 mg | Freq: Once | ORAL | Status: AC | PRN

## 2015-03-09 MED ORDER — LIDOCAINE HCL (CARDIAC) 20 MG/ML IV SOLN
INTRAVENOUS | Status: DC | PRN
  Administered 2015-03-09: 80 mg via INTRATRACHEAL

## 2015-03-09 MED ORDER — FENTANYL CITRATE (PF) 100 MCG/2ML IJ SOLN
INTRAMUSCULAR | Status: DC | PRN
  Administered 2015-03-09: 50 ug via INTRAVENOUS
  Administered 2015-03-09: 100 ug via INTRAVENOUS

## 2015-03-09 MED ORDER — OXYCODONE HCL 5 MG PO TABS
ORAL_TABLET | ORAL | Status: AC
  Administered 2015-03-09: 16:00:00
  Filled 2015-03-09: qty 1

## 2015-03-09 MED ORDER — MORPHINE SULFATE (PF) 2 MG/ML IV SOLN: 2.0000 mg | INTRAVENOUS | Status: DC | PRN

## 2015-03-09 MED ORDER — LIDOCAINE-EPINEPHRINE 1 %-1:100000 IJ SOLN
INTRAMUSCULAR | Status: DC | PRN
  Administered 2015-03-09: 2 mL

## 2015-03-09 MED ORDER — MIDAZOLAM HCL 2 MG/2ML IJ SOLN
INTRAMUSCULAR | Status: AC
  Filled 2015-03-09: qty 4

## 2015-03-09 MED ORDER — ROCURONIUM BROMIDE 50 MG/5ML IV SOLN
INTRAVENOUS | Status: AC
  Filled 2015-03-09: qty 1

## 2015-03-09 MED ORDER — PROMETHAZINE HCL 25 MG PO TABS: 12.5000 mg | ORAL_TABLET | Freq: Four times a day (QID) | ORAL | Status: DC | PRN

## 2015-03-09 MED ORDER — NEOSTIGMINE METHYLSULFATE 10 MG/10ML IV SOLN
INTRAVENOUS | Status: DC | PRN
  Administered 2015-03-09: 3 mg via INTRAVENOUS

## 2015-03-09 MED ORDER — FENTANYL CITRATE (PF) 250 MCG/5ML IJ SOLN
INTRAMUSCULAR | Status: AC
  Filled 2015-03-09: qty 5

## 2015-03-09 MED ORDER — LACTATED RINGERS IV SOLN
INTRAVENOUS | Status: DC
  Administered 2015-03-09 (×2): via INTRAVENOUS

## 2015-03-09 MED ORDER — ONDANSETRON HCL 4 MG/2ML IJ SOLN
INTRAMUSCULAR | Status: DC | PRN
  Administered 2015-03-09: 4 mg via INTRAVENOUS

## 2015-03-09 MED ORDER — SUCCINYLCHOLINE 20MG/ML (10ML) SYRINGE FOR MEDFUSION PUMP - OPTIME
INTRAMUSCULAR | Status: DC | PRN
  Administered 2015-03-09: 80 mg via INTRAVENOUS

## 2015-03-09 MED ORDER — DEXAMETHASONE SODIUM PHOSPHATE 4 MG/ML IJ SOLN
INTRAMUSCULAR | Status: AC
  Filled 2015-03-09: qty 1

## 2015-03-09 MED ORDER — LIDOCAINE-EPINEPHRINE 1 %-1:100000 IJ SOLN
INTRAMUSCULAR | Status: AC
  Filled 2015-03-09: qty 1

## 2015-03-09 SURGICAL SUPPLY — 45 items
BENZOIN TINCTURE PRP APPL 2/3 (GAUZE/BANDAGES/DRESSINGS) ×2 IMPLANT
BLADE SURG 15 STRL LF DISP TIS (BLADE) IMPLANT
BLADE SURG 15 STRL SS (BLADE)
CANISTER SUCTION 2500CC (MISCELLANEOUS) ×2 IMPLANT
CLEANER TIP ELECTROSURG 2X2 (MISCELLANEOUS) ×2 IMPLANT
CONT SPEC 4OZ CLIKSEAL STRL BL (MISCELLANEOUS) IMPLANT
CORDS BIPOLAR (ELECTRODE) ×2 IMPLANT
COVER SURGICAL LIGHT HANDLE (MISCELLANEOUS) ×2 IMPLANT
CRADLE DONUT ADULT HEAD (MISCELLANEOUS) IMPLANT
DRAIN JACKSON RD 7FR 3/32 (WOUND CARE) ×2 IMPLANT
DRAIN SNY 10 ROU (WOUND CARE) IMPLANT
DRAPE PROXIMA HALF (DRAPES) IMPLANT
ELECT COATED BLADE 2.86 ST (ELECTRODE) ×2 IMPLANT
ELECT REM PT RETURN 9FT ADLT (ELECTROSURGICAL) ×2
ELECTRODE REM PT RTRN 9FT ADLT (ELECTROSURGICAL) ×1 IMPLANT
EVACUATOR SILICONE 100CC (DRAIN) ×2 IMPLANT
FORCEPS BIPOLAR SPETZLER 8 1.0 (NEUROSURGERY SUPPLIES) ×2 IMPLANT
GAUZE SPONGE 4X4 16PLY XRAY LF (GAUZE/BANDAGES/DRESSINGS) ×2 IMPLANT
GLOVE BIO SURGEON STRL SZ 6.5 (GLOVE) ×6 IMPLANT
GLOVE BIO SURGEON STRL SZ7.5 (GLOVE) ×2 IMPLANT
GLOVE BIOGEL PI IND STRL 6.5 (GLOVE) ×3 IMPLANT
GLOVE BIOGEL PI INDICATOR 6.5 (GLOVE) ×3
GLOVE SS BIOGEL STRL SZ 6.5 (GLOVE) ×1 IMPLANT
GLOVE SUPERSENSE BIOGEL SZ 6.5 (GLOVE) ×1
GOWN STRL REUS W/ TWL LRG LVL3 (GOWN DISPOSABLE) ×2 IMPLANT
GOWN STRL REUS W/TWL LRG LVL3 (GOWN DISPOSABLE) ×2
KIT BASIN OR (CUSTOM PROCEDURE TRAY) ×2 IMPLANT
KIT ROOM TURNOVER OR (KITS) ×2 IMPLANT
LIQUID BAND (GAUZE/BANDAGES/DRESSINGS) ×2 IMPLANT
LOCATOR NERVE 3 VOLT (DISPOSABLE) IMPLANT
NEEDLE HYPO 25GX1X1/2 BEV (NEEDLE) ×2 IMPLANT
NS IRRIG 1000ML POUR BTL (IV SOLUTION) ×2 IMPLANT
PAD ARMBOARD 7.5X6 YLW CONV (MISCELLANEOUS) ×4 IMPLANT
PENCIL BUTTON HOLSTER BLD 10FT (ELECTRODE) ×2 IMPLANT
SHEARS HARMONIC 9CM CVD (BLADE) ×2 IMPLANT
SPONGE INTESTINAL PEANUT (DISPOSABLE) IMPLANT
STAPLER VISISTAT 35W (STAPLE) ×2 IMPLANT
SUT ETHILON 2 0 FS 18 (SUTURE) ×4 IMPLANT
SUT SILK 3 0 REEL (SUTURE) ×2 IMPLANT
SUT VIC AB 3-0 SH 27 (SUTURE) ×2
SUT VIC AB 3-0 SH 27X BRD (SUTURE) ×2 IMPLANT
SUT VICRYL 4-0 PS2 18IN ABS (SUTURE) ×4 IMPLANT
TOWEL OR 17X24 6PK STRL BLUE (TOWEL DISPOSABLE) ×2 IMPLANT
TRAY ENT MC OR (CUSTOM PROCEDURE TRAY) ×2 IMPLANT
TUBE FEEDING 10FR FLEXIFLO (MISCELLANEOUS) IMPLANT

## 2015-03-09 NOTE — H&P (Signed)
[name] is an 54 y.o. female.   Chief Complaint: Left thyroid mass HPI: 54 year old female with left-sided thyroid mass noticed on recent routine exam.  FNA revealed follicular lesion.  She has elected to proceed with removal.  Past Medical History  Diagnosis Date  . Heart murmur   . Hyperlipidemia   . Incontinence of urine   . Depression     hx  . Anxiety     hx  . GERD (gastroesophageal reflux disease)     occ tums  . Arthritis   . Anemia     hx    Past Surgical History  Procedure Laterality Date  . Tubal ligation    . Wisdom tooth extraction      Family History  Problem Relation Age of Onset  . Diabetes Mother   . Cancer Mother     colon cancer and breast cancer  . Depression Mother     Manic depression  . Heart disease Father   . Hypertension Father   . Cancer Maternal Grandmother     ovarian cancer  . Stomach cancer Paternal Aunt   . Colon cancer Neg Hx   . Esophageal cancer Neg Hx   . Rectal cancer Neg Hx    Social History:  reports that she has never smoked. She has never used smokeless tobacco. She reports that she does not drink alcohol or use illicit drugs.  Allergies: No Known Allergies  Medications Prior to Admission  Medication Sig Dispense Refill  . cholecalciferol (VITAMIN D) 1000 UNITS tablet Take 1,000 Units by mouth daily.    Nyoka Cowden Tea, Camillia sinensis, (GREEN TEA EXTRACT PO) Take 1 capsule by mouth daily. Triple Strength    . ibuprofen (ADVIL,MOTRIN) 200 MG tablet Take 400 mg by mouth every 6 (six) hours as needed (pain).    . Misc Natural Products (GLUCOSAMINE CHONDROITIN MSM PO) Take 2 tablets by mouth daily. Glucosamine 1500 mg, chondroitin 1200 mg, MSM 500 mg    . Multiple Vitamin (MULTIVITAMIN WITH MINERALS) TABS tablet Take 1 tablet by mouth daily. Women's Alive    . naproxen sodium (ALEVE) 220 MG tablet Take 440 mg by mouth 2 (two) times daily as needed (pain).    Marland Kitchen OVER THE COUNTER MEDICATION Take 1 tablet by mouth 2 (two)  times daily. Kre-alkalyn (buffered creatine)    . vitamin B-12 (CYANOCOBALAMIN) 500 MCG tablet Take 500 mcg by mouth daily.    . calcium carbonate (TUMS EX) 750 MG chewable tablet Chew 2 tablets by mouth 2 (two) times daily as needed for heartburn.    . clobetasol ointment (TEMOVATE) 7.25 % Apply 1 application topically 2 (two) times daily. (Patient not taking: Reported on 12/25/2014) 30 g 0    Results for orders placed or performed during the hospital encounter of 03/09/15 (from the past 48 hour(s))  hCG, serum, qualitative     Status: None   Collection Time: 03/09/15  7:44 AM  Result Value Ref Range   Preg, Serum NEGATIVE NEGATIVE    Comment:        THE SENSITIVITY OF THIS METHODOLOGY IS >10 mIU/mL.    No results found.  Review of Systems  All other systems reviewed and are negative.   Blood pressure 111/69, pulse 56, temperature 98.7 F (37.1 C), temperature source Oral, resp. rate 18, height 5' 4.5" (1.638 m), weight 68.493 kg (151 lb), last menstrual period 03/02/2015, SpO2 100 %. Physical Exam  Constitutional: She is oriented to person, place,  and time. She appears well-developed and well-nourished. No distress.  HENT:  Head: Normocephalic and atraumatic.  Right Ear: External ear normal.  Left Ear: External ear normal.  Nose: Nose normal.  Mouth/Throat: Oropharynx is clear and moist.  Normal voice.  Eyes: Conjunctivae and EOM are normal. Pupils are equal, round, and reactive to light.  Neck: Normal range of motion. Neck supple.  Thyroid with round, 4 cm mass in left side.  Cardiovascular: Normal rate.   Respiratory: Effort normal.  Musculoskeletal: Normal range of motion.  Neurological: She is alert and oriented to person, place, and time. No cranial nerve deficit.  Skin: Skin is warm and dry.  Psychiatric: She has a normal mood and affect. Her behavior is normal. Judgment and thought content normal.     Assessment/Plan Left thyroid mass, follicular lesion To OR for  left thyroidectomy, possible total.  Plan overnight observation with drain in place.  Chirstopher Iovino 03/09/2015, 9:45 AM

## 2015-03-09 NOTE — Brief Op Note (Signed)
03/09/2015  11:24 AM  PATIENT:  [name]  54 y.o. female  PRE-OPERATIVE DIAGNOSIS:  Left Thyroid mass  POST-OPERATIVE DIAGNOSIS:  Left Thyroid Mass  PROCEDURE:  Procedure(s): LEFT THYROID LOBECTOMY (Left)  SURGEON:  Surgeon(s) and Role:    * Melida Quitter, MD - Primary  PHYSICIAN ASSISTANT: Sallee Provencal  ASSISTANTS: none   ANESTHESIA:   general  EBL:     BLOOD ADMINISTERED:none  DRAINS: Penrose drain in the thyroid bed   LOCAL MEDICATIONS USED:  LIDOCAINE   SPECIMEN:  Source of Specimen:  Left thyroid lobe  DISPOSITION OF SPECIMEN:  PATHOLOGY  COUNTS:  YES  TOURNIQUET:  * No tourniquets in log *  DICTATION: .Other Dictation: Dictation Number [id number]  PLAN OF CARE: Admit for overnight observation  PATIENT DISPOSITION:  PACU - hemodynamically stable.   Delay start of Pharmacological VTE agent (>24hrs) due to surgical blood loss or risk of bleeding: no

## 2015-03-09 NOTE — Anesthesia Postprocedure Evaluation (Signed)
  Anesthesia Post-op Note  Patient: [name]  Procedure(s) Performed: Procedure(s) (LRB): LEFT THYROID LOBECTOMY (Left)  Patient Location: PACU  Anesthesia Type: General  Level of Consciousness: awake and alert   Airway and Oxygen Therapy: Patient Spontanous Breathing  Post-op Pain: mild  Post-op Assessment: Post-op Vital signs reviewed, Patient's Cardiovascular Status Stable, Respiratory Function Stable, Patent Airway and No signs of Nausea or vomiting  Last Vitals:  Filed Vitals:   03/09/15 1340  BP: 106/67  Pulse: 52  Temp: 36.6 C  Resp: 14    Post-op Vital Signs: stable   Complications: No apparent anesthesia complications

## 2015-03-09 NOTE — Progress Notes (Signed)
Patient ID: [name], female   DOB: 1960/12/28, 54 y.o.   MRN: [id number]  Doing well, alert and awake. oice normal. Incision clean dry and intact, JP holding a seal.   Stable post op, Continue overnight observation.

## 2015-03-09 NOTE — Anesthesia Preprocedure Evaluation (Signed)
Anesthesia Evaluation  Patient identified by MRN, date of birth, ID band Patient awake    Reviewed: Allergy & Precautions, H&P , NPO status , Patient's Chart, lab work & pertinent test results  History of Anesthesia Complications Negative for: history of anesthetic complications  Airway Mallampati: II  TM Distance: >3 FB Neck ROM: full    Dental no notable dental hx.    Pulmonary neg pulmonary ROS,    Pulmonary exam normal breath sounds clear to auscultation       Cardiovascular negative cardio ROS Normal cardiovascular exam+ Valvular Problems/Murmurs  Rhythm:regular Rate:Normal     Neuro/Psych Anxiety Depression negative neurological ROS     GI/Hepatic Neg liver ROS, GERD  ,  Endo/Other  negative endocrine ROS  Renal/GU negative Renal ROS     Musculoskeletal  (+) Arthritis ,   Abdominal   Peds  Hematology   Anesthesia Other Findings   Reproductive/Obstetrics negative OB ROS                             Anesthesia Physical Anesthesia Plan  ASA: II  Anesthesia Plan: General   Post-op Pain Management:    Induction: Intravenous  Airway Management Planned: Oral ETT  Additional Equipment:   Intra-op Plan:   Post-operative Plan:   Informed Consent: I have reviewed the patients History and Physical, chart, labs and discussed the procedure including the risks, benefits and alternatives for the proposed anesthesia with the patient or authorized representative who has indicated his/her understanding and acceptance.   Dental Advisory Given  Plan Discussed with: Anesthesiologist, CRNA and Surgeon  Anesthesia Plan Comments: (NIMS tube vs. Regular ETT)        Anesthesia Quick Evaluation

## 2015-03-09 NOTE — Anesthesia Procedure Notes (Signed)
Procedure Name: Intubation Date/Time: 03/09/2015 10:08 AM Performed by: [place] Pink Pre-anesthesia Checklist: Patient identified, Emergency Drugs available, Suction available, Patient being monitored and Timeout performed Patient Re-evaluated:Patient Re-evaluated prior to inductionOxygen Delivery Method: Circle system utilized Preoxygenation: Pre-oxygenation with 100% oxygen Intubation Type: IV induction Ventilation: Mask ventilation without difficulty Laryngoscope Size: Mac and 4 Grade View: Grade I Tube type: Oral Tube size: 7.0 mm Number of attempts: 1 Airway Equipment and Method: Stylet and LTA kit utilized Placement Confirmation: ETT inserted through vocal cords under direct vision,  positive ETCO2 and breath sounds checked- equal and bilateral Secured at: 21 cm Tube secured with: Tape Dental Injury: Teeth and Oropharynx as per pre-operative assessment

## 2015-03-09 NOTE — Transfer of Care (Signed)
Immediate Anesthesia Transfer of Care Note  Patient: [name]  Procedure(s) Performed: Procedure(s): LEFT THYROID LOBECTOMY (Left)  Patient Location: PACU  Anesthesia Type:General  Level of Consciousness: awake and alert   Airway & Oxygen Therapy: Patient Spontanous Breathing and Patient connected to nasal cannula oxygen  Post-op Assessment: Report given to RN and Post -op Vital signs reviewed and stable  Post vital signs: Reviewed and stable  Last Vitals:  Filed Vitals:   03/09/15 0800  BP: 111/69  Pulse: 56  Temp: 37.1 C  Resp: 18    Complications: No apparent anesthesia complications

## 2015-03-10 DIAGNOSIS — D34 Benign neoplasm of thyroid gland: Secondary | ICD-10-CM | POA: Diagnosis not present

## 2015-03-10 MED ORDER — HYDROCODONE-ACETAMINOPHEN 7.5-325 MG PO TABS: 1.0000 | ORAL_TABLET | Freq: Four times a day (QID) | ORAL | Status: DC | PRN

## 2015-03-10 NOTE — Op Note (Signed)
NAMEJANESHIA, [name] NO.:  [id number]  MEDICAL RECORD NO.:  [id number]  LOCATION:  6N14C                        FACILITY:  [facility]  PHYSICIAN:  Onnie Graham, MD     DATE OF BIRTH:  November 15, 1960  DATE OF PROCEDURE:  03/09/2015 DATE OF DISCHARGE:                              OPERATIVE REPORT   PREOPERATIVE DIAGNOSIS:  Left thyroid mass.  POSTOPERATIVE DIAGNOSIS:  Left thyroid mass.  PROCEDURE:  Left thyroid lobectomy.  SURGEON:  Onnie Graham, MD  ASSISTANT:  Jolene Provost, PA-C  ANESTHESIA:  General endotracheal anesthesia.  COMPLICATIONS:  None.  INDICATION:  The patient is a 54 year old female who recently had a left thyroid mass detected on routine exam by her doctor.  Ultrasound was performed and a fine-needle aspiration followed.  This demonstrated a follicular lesion.  We discussed sending separate specimen for molecular testing, but she wished to proceed with excision of the left thyroid lobe mass for biopsy.  FINDINGS:  Much of the left thyroid lobe was replaced by a soft round mass.  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  DESCRIPTION OF PROCEDURE:  The patient was identified in the holding room and informed consent having been obtained including discussion of risks, benefits, alternatives, the patient was brought to the operative suite, and put on the operating table in a supine position.  Anesthesia was induced and the patient was intubated by the anesthesia team without difficulty.  The patient was given intravenous antibiotics during the case.  The eyes taped closed and a shoulder roll was placed.  The neck incision was marked with a marking pen and injected with 1% lidocaine with 1:100,000 epinephrine.  The neck was prepped and draped in sterile fashion.  The incision was made with a 15-blade scalpel and extended through the subcutaneous and platysma layers using Bovie electrocautery. Subplatysmal  flaps were elevated superiorly and inferiorly and a self- retaining retractor was added.  The midline raphe of strap muscles was divided and the left-sided strap muscle was then retracted and dissected off the thyroid lobe.  Careful dissection was then started around the edge of the thyroid lobe starting inferiorly and extending along the capsule while dividing vessels with the Harmonic Scalpel.  Care was taken to dissect free tissue that appeared to look like parathyroid gland tissue inferiorly.  Dissection continued along the capsule toward the superior pole where the pedicle was divided using the Harmonic Scalpel.  Dissection then continued around the deep surface of the lobe staying along the capsule and working toward the Berry's ligament area. Recurrent laryngeal nerve was seen during this part of the case, but was well away from the dissection.  Continued dissection was then carefully performed to the isthmus, which was then divided with Bovie electrocautery.  The lobe was then passed to nursing for Pathology and then frozen section result listed above was returned.  The wound was copiously irrigated with saline.  Some bleeding was controlled bipolar electrocautery.  The patient was given Valsalva maneuver and there was no additional bleeding.  A 7-French round drain was placed in the depth of the wound and secured to the skin using 2-0 nylon  suture in a standard drain stitch.  The midline raphe a was closed with 3-0 Vicryl suture in a simple interrupted fashion.  The platysmal layer was closed in the same fashion.  The subcutaneous layer was closed with 4-0 Vicryl suture in a simple interrupted fashion.  After the frozen section result returned, the skin was treated with Dermabond along the incision.  She was then cleaned off and the drain taped to the left shoulder and changed to bulb suction.  She was then returned to anesthesia for wake- up and was extubated and moved to the  recovery room in a stable condition.     Onnie Graham, MD     DDB/MEDQ  D:  03/09/2015  T:  03/10/2015  Job:  [id number]

## 2015-03-10 NOTE — Discharge Summary (Signed)
  Physician Discharge Summary  Patient ID: [name] MRN: [id number] DOB/AGE: Apr 26, 1961 54 y.o.  Admit date: 03/09/2015 Discharge date: 03/10/2015  Admission Diagnoses:Thyroid nodule  Discharge Diagnoses:  Active Problems:   Thyroid nodule   Discharged Condition: good  Hospital Course: no complications  Consults: none  Significant Diagnostic Studies: none  Treatments: surgery: thyroid lobectomy  Discharge Exam: Blood pressure 95/58, pulse 58, temperature 98.3 F (36.8 C), temperature source Oral, resp. rate 19, height 5' 4.5" (1.638 m), weight 68.493 kg (151 lb), last menstrual period 03/02/2015, SpO2 96 %. PHYSICAL EXAM: Voice normal, neck excellent. JP removed.  Disposition: 01-Home or Self Care     Medication List    TAKE these medications        ALEVE 220 MG tablet  Generic drug:  naproxen sodium  Take 440 mg by mouth 2 (two) times daily as needed (pain).     calcium carbonate 750 MG chewable tablet  Commonly known as:  TUMS EX  Chew 2 tablets by mouth 2 (two) times daily as needed for heartburn.     cholecalciferol 1000 UNITS tablet  Commonly known as:  VITAMIN D  Take 1,000 Units by mouth daily.     clobetasol ointment 0.05 %  Commonly known as:  TEMOVATE  Apply 1 application topically 2 (two) times daily.     GLUCOSAMINE CHONDROITIN MSM PO  Take 2 tablets by mouth daily. Glucosamine 1500 mg, chondroitin 1200 mg, MSM 500 mg     GREEN TEA EXTRACT PO  Take 1 capsule by mouth daily. Triple Strength     HYDROcodone-acetaminophen 7.5-325 MG tablet  Commonly known as:  NORCO  Take 1 tablet by mouth every 6 (six) hours as needed for moderate pain.     ibuprofen 200 MG tablet  Commonly known as:  ADVIL,MOTRIN  Take 400 mg by mouth every 6 (six) hours as needed (pain).     multivitamin with minerals Tabs tablet  Take 1 tablet by mouth daily. Women's Alive     OVER THE COUNTER MEDICATION  Take 1 tablet by mouth 2 (two) times daily. Kre-alkalyn  (buffered creatine)     vitamin B-12 500 MCG tablet  Commonly known as:  CYANOCOBALAMIN  Take 500 mcg by mouth daily.           Follow-up Information    Follow up with BATES, DWIGHT, MD. Schedule an appointment as soon as possible for a visit in 1 week.   Specialty:  Otolaryngology   Contact information:   [address] [place] [address] [phone]       Signed: Izora Gala 03/10/2015, 9:01 AM

## 2015-03-10 NOTE — Progress Notes (Signed)
Pt discharged to home.  Discharge instructions explained to pt.  Pt has no questions at the time of discharge.  Pt states she has all belongings.  IV removed.  Pt taken off unit via wheelchair by NT.

## 2015-03-12 ENCOUNTER — Encounter (HOSPITAL_COMMUNITY): Payer: Self-pay | Admitting: Otolaryngology

## 2015-05-13 HISTORY — PX: BREAST BIOPSY: SHX20

## 2015-05-25 ENCOUNTER — Ambulatory Visit (INDEPENDENT_AMBULATORY_CARE_PROVIDER_SITE_OTHER): Payer: BLUE CROSS/BLUE SHIELD | Admitting: Family Medicine

## 2015-05-25 ENCOUNTER — Encounter: Payer: Self-pay | Admitting: Family Medicine

## 2015-05-25 VITALS — BP 128/86 | HR 70 | Temp 98.0°F | Ht 65.0 in | Wt 155.0 lb

## 2015-05-25 DIAGNOSIS — F418 Other specified anxiety disorders: Secondary | ICD-10-CM | POA: Diagnosis not present

## 2015-05-25 MED ORDER — ALPRAZOLAM 0.5 MG PO TABS: 0.5000 mg | ORAL_TABLET | Freq: Two times a day (BID) | ORAL | Status: DC | PRN

## 2015-05-25 MED ORDER — VENLAFAXINE HCL ER 37.5 MG PO CP24: 37.5000 mg | ORAL_CAPSULE | Freq: Every day | ORAL | Status: DC

## 2015-05-25 NOTE — Assessment & Plan Note (Signed)
Deteriorated.  Pt reports she is crying at work and she doesn't want to lose her job.  At first, only wanted to take med as needed.  Informed her that due to the regularity of her stress and anxiety, this is not the best course of tx.  She is agreeable to restart a daily medication.  She did well on Effexor previously so will restart.  She is to use Alprazolam only as needed.  Pt expressed understanding and is in agreement w/ plan.
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
## 2015-05-25 NOTE — Progress Notes (Signed)
Pre visit review using our clinic review tool, if applicable. No additional management support is needed unless otherwise documented below in the visit note. 

## 2015-05-25 NOTE — Patient Instructions (Signed)
Follow up in 4-6 weeks to recheck anxiety Start the Effexor daily in the morning Use the Alprazolam only as needed for panicked moments Call with any questions or concerns Hang in there!!!

## 2015-07-09 ENCOUNTER — Ambulatory Visit: Payer: BLUE CROSS/BLUE SHIELD | Admitting: Family

## 2015-11-23 ENCOUNTER — Encounter: Payer: BLUE CROSS/BLUE SHIELD | Admitting: Family Medicine

## 2015-12-24 ENCOUNTER — Other Ambulatory Visit: Payer: Self-pay | Admitting: Family Medicine

## 2015-12-24 DIAGNOSIS — Z1231 Encounter for screening mammogram for malignant neoplasm of breast: Secondary | ICD-10-CM

## 2016-01-25 ENCOUNTER — Ambulatory Visit
Admission: RE | Admit: 2016-01-25 | Discharge: 2016-01-25 | Disposition: A | Discharge status: Home / Self Care | Payer: BLUE CROSS/BLUE SHIELD | Source: Ambulatory Visit | Attending: Family Medicine | Admitting: Family Medicine

## 2016-01-25 DIAGNOSIS — Z1231 Encounter for screening mammogram for malignant neoplasm of breast: Secondary | ICD-10-CM | POA: Diagnosis not present

## 2016-02-08 ENCOUNTER — Encounter: Payer: BLUE CROSS/BLUE SHIELD | Admitting: Family Medicine

## 2016-02-22 DIAGNOSIS — D259 Leiomyoma of uterus, unspecified: Secondary | ICD-10-CM | POA: Diagnosis not present

## 2016-02-22 DIAGNOSIS — Z0001 Encounter for general adult medical examination with abnormal findings: Secondary | ICD-10-CM | POA: Diagnosis not present

## 2016-02-22 DIAGNOSIS — R0789 Other chest pain: Secondary | ICD-10-CM | POA: Diagnosis not present

## 2016-03-31 DIAGNOSIS — R319 Hematuria, unspecified: Secondary | ICD-10-CM | POA: Diagnosis not present

## 2016-03-31 DIAGNOSIS — N841 Polyp of cervix uteri: Secondary | ICD-10-CM | POA: Diagnosis not present

## 2016-06-20 DIAGNOSIS — L570 Actinic keratosis: Secondary | ICD-10-CM | POA: Diagnosis not present

## 2016-06-20 DIAGNOSIS — L814 Other melanin hyperpigmentation: Secondary | ICD-10-CM | POA: Diagnosis not present

## 2016-06-20 DIAGNOSIS — L308 Other specified dermatitis: Secondary | ICD-10-CM | POA: Diagnosis not present

## 2016-06-20 DIAGNOSIS — L821 Other seborrheic keratosis: Secondary | ICD-10-CM | POA: Diagnosis not present

## 2016-06-20 DIAGNOSIS — D235 Other benign neoplasm of skin of trunk: Secondary | ICD-10-CM | POA: Diagnosis not present

## 2016-07-25 ENCOUNTER — Other Ambulatory Visit: Payer: Self-pay | Admitting: Obstetrics & Gynecology

## 2016-07-25 DIAGNOSIS — Z01419 Encounter for gynecological examination (general) (routine) without abnormal findings: Secondary | ICD-10-CM | POA: Diagnosis not present

## 2016-07-25 DIAGNOSIS — Z124 Encounter for screening for malignant neoplasm of cervix: Secondary | ICD-10-CM | POA: Diagnosis not present

## 2016-07-25 DIAGNOSIS — Z113 Encounter for screening for infections with a predominantly sexual mode of transmission: Secondary | ICD-10-CM | POA: Diagnosis not present

## 2016-07-25 DIAGNOSIS — Z6828 Body mass index (BMI) 28.0-28.9, adult: Secondary | ICD-10-CM | POA: Diagnosis not present

## 2016-07-28 DIAGNOSIS — N959 Unspecified menopausal and perimenopausal disorder: Secondary | ICD-10-CM | POA: Diagnosis not present

## 2016-07-30 LAB — CYTOLOGY - PAP

## 2016-08-25 DIAGNOSIS — S335XXA Sprain of ligaments of lumbar spine, initial encounter: Secondary | ICD-10-CM | POA: Diagnosis not present

## 2016-08-31 IMAGING — CR DG CHEST 2V
2 series · 2 of 2 positions shown · non-contrast
Comparison: 02/22/2013

CLINICAL DATA: Preop for thyroidectomy

EXAM:
CHEST  2 VIEW

[w chest pa]
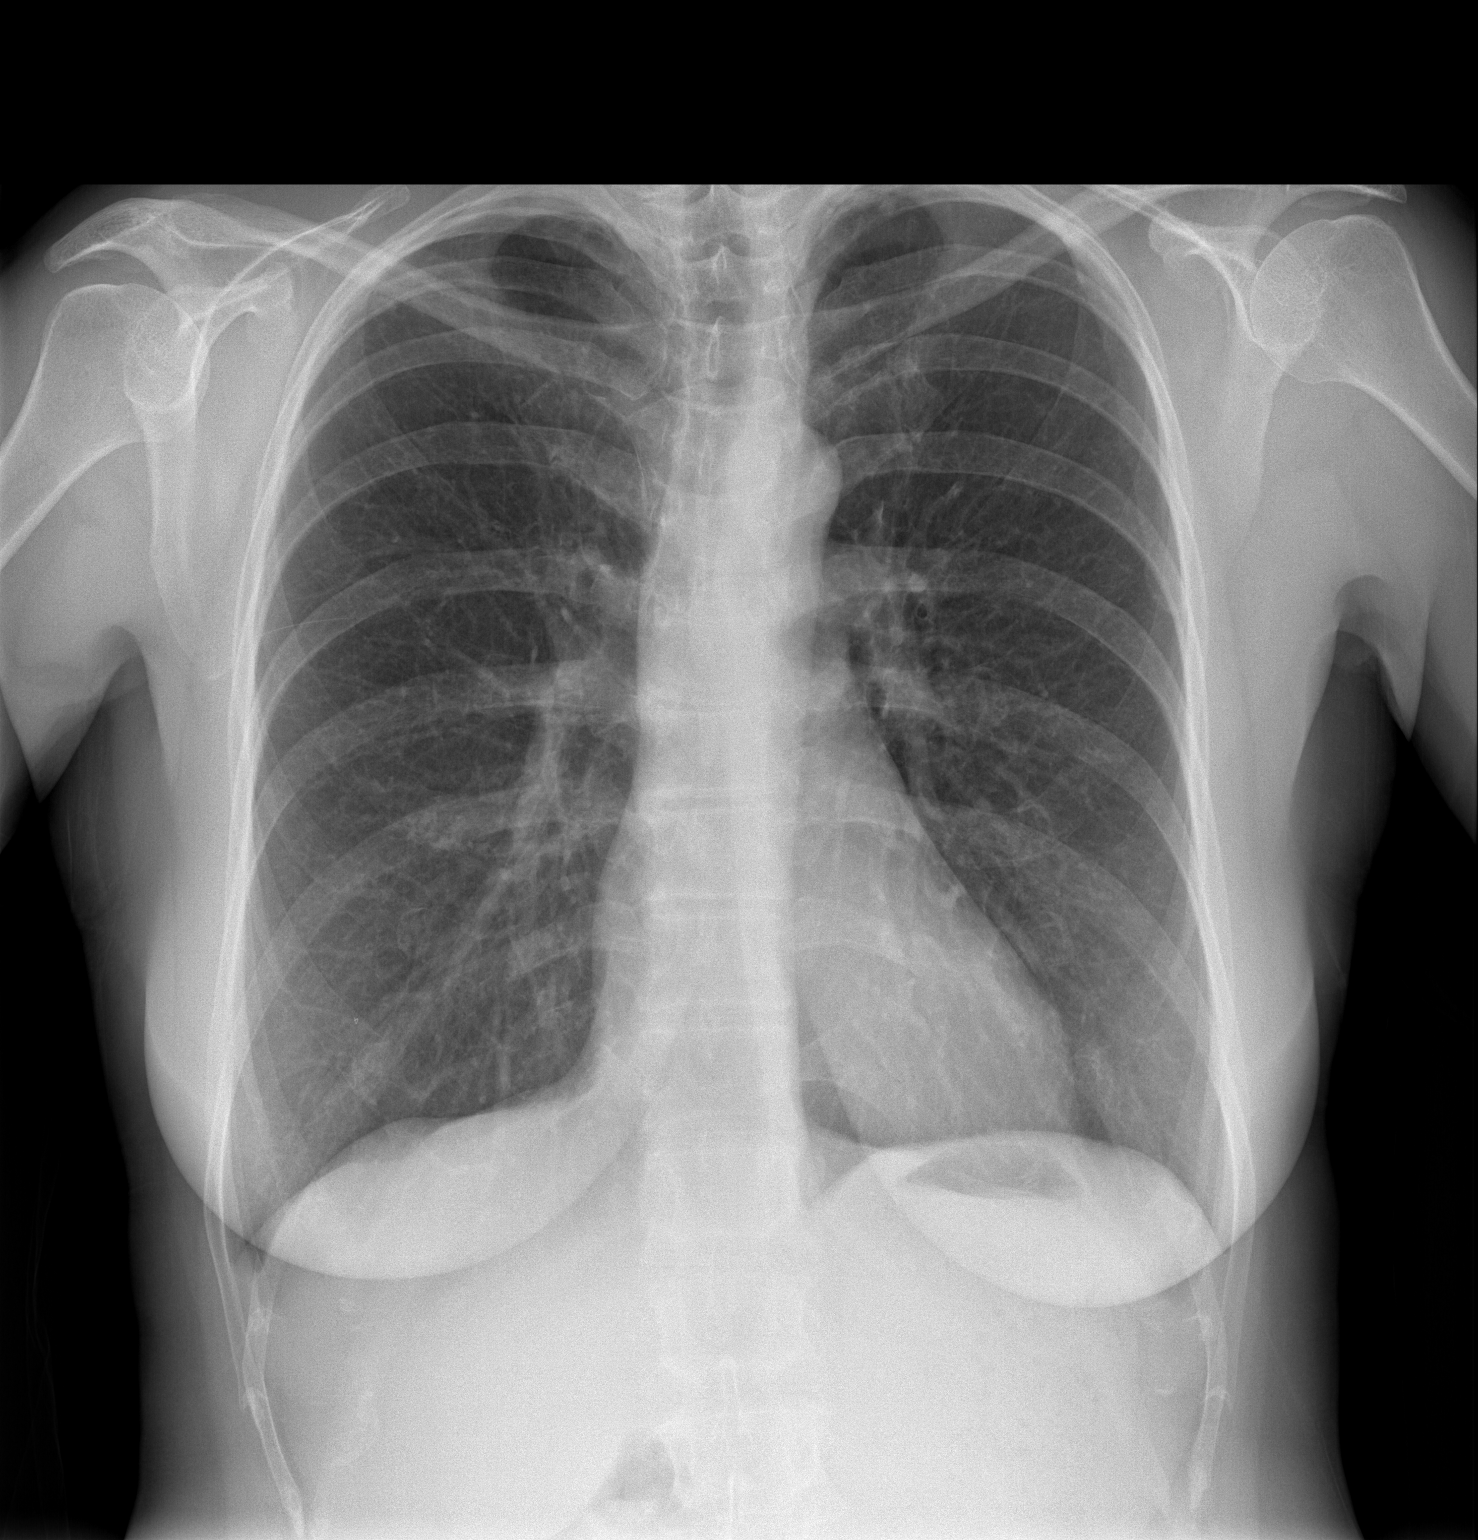

[w chest lat]
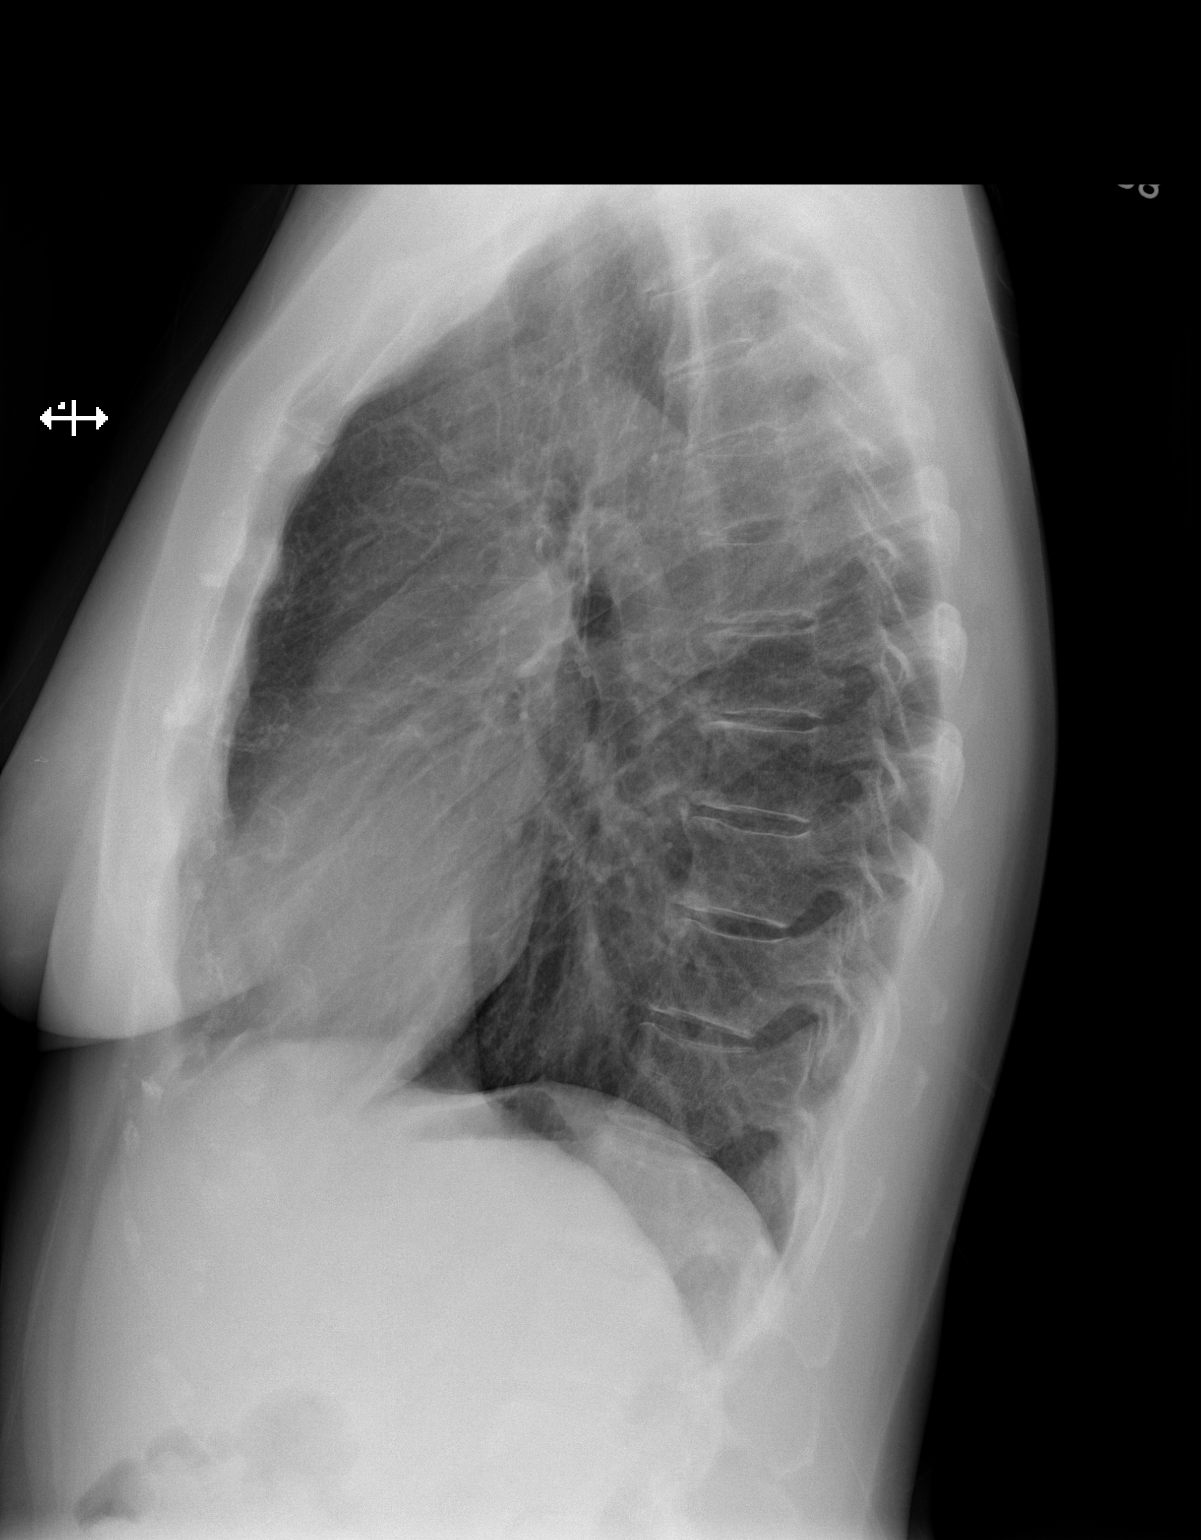

[2 of 2 positions shown; findings below may reference images not displayed]

FINDINGS: The heart size and mediastinal contours are within normal limits.
Both lungs are clear. Minimal degenerative changes thoracic spine.
IMPRESSION: No active cardiopulmonary disease.

## 2016-09-12 ENCOUNTER — Ambulatory Visit: Payer: BLUE CROSS/BLUE SHIELD | Admitting: Family

## 2016-09-16 ENCOUNTER — Other Ambulatory Visit: Payer: Self-pay | Admitting: Obstetrics & Gynecology

## 2016-09-16 DIAGNOSIS — Z6828 Body mass index (BMI) 28.0-28.9, adult: Secondary | ICD-10-CM | POA: Diagnosis not present

## 2016-09-16 DIAGNOSIS — N950 Postmenopausal bleeding: Secondary | ICD-10-CM | POA: Diagnosis not present

## 2016-09-16 DIAGNOSIS — N939 Abnormal uterine and vaginal bleeding, unspecified: Secondary | ICD-10-CM | POA: Diagnosis not present

## 2016-09-30 DIAGNOSIS — N840 Polyp of corpus uteri: Secondary | ICD-10-CM | POA: Diagnosis not present

## 2016-09-30 DIAGNOSIS — Z6828 Body mass index (BMI) 28.0-28.9, adult: Secondary | ICD-10-CM | POA: Diagnosis not present

## 2016-09-30 DIAGNOSIS — N938 Other specified abnormal uterine and vaginal bleeding: Secondary | ICD-10-CM | POA: Diagnosis not present

## 2016-10-20 ENCOUNTER — Encounter: Payer: Self-pay | Admitting: *Deleted

## 2016-12-31 DIAGNOSIS — M79671 Pain in right foot: Secondary | ICD-10-CM | POA: Diagnosis not present

## 2016-12-31 DIAGNOSIS — M79672 Pain in left foot: Secondary | ICD-10-CM | POA: Diagnosis not present

## 2016-12-31 DIAGNOSIS — K219 Gastro-esophageal reflux disease without esophagitis: Secondary | ICD-10-CM | POA: Diagnosis not present

## 2016-12-31 DIAGNOSIS — R079 Chest pain, unspecified: Secondary | ICD-10-CM | POA: Diagnosis not present

## 2017-01-29 ENCOUNTER — Encounter: Payer: Self-pay | Admitting: *Deleted

## 2017-01-29 DIAGNOSIS — J302 Other seasonal allergic rhinitis: Secondary | ICD-10-CM | POA: Diagnosis not present

## 2017-02-19 ENCOUNTER — Telehealth: Payer: Self-pay | Admitting: *Deleted

## 2017-02-19 NOTE — Telephone Encounter (Signed)
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

## 2017-02-23 ENCOUNTER — Ambulatory Visit: Payer: BLUE CROSS/BLUE SHIELD | Admitting: Internal Medicine

## 2017-02-25 ENCOUNTER — Encounter: Payer: Self-pay | Admitting: Internal Medicine

## 2017-02-27 DIAGNOSIS — Z13220 Encounter for screening for lipoid disorders: Secondary | ICD-10-CM | POA: Diagnosis not present

## 2017-02-27 DIAGNOSIS — Z1159 Encounter for screening for other viral diseases: Secondary | ICD-10-CM | POA: Diagnosis not present

## 2017-02-27 DIAGNOSIS — Z0000 Encounter for general adult medical examination without abnormal findings: Secondary | ICD-10-CM | POA: Diagnosis not present

## 2017-03-13 DIAGNOSIS — N938 Other specified abnormal uterine and vaginal bleeding: Secondary | ICD-10-CM | POA: Diagnosis not present

## 2017-03-13 DIAGNOSIS — Z6828 Body mass index (BMI) 28.0-28.9, adult: Secondary | ICD-10-CM | POA: Diagnosis not present

## 2017-03-20 DIAGNOSIS — K219 Gastro-esophageal reflux disease without esophagitis: Secondary | ICD-10-CM | POA: Diagnosis not present

## 2017-03-20 DIAGNOSIS — D126 Benign neoplasm of colon, unspecified: Secondary | ICD-10-CM | POA: Diagnosis not present

## 2017-03-27 DIAGNOSIS — M7752 Other enthesopathy of left foot: Secondary | ICD-10-CM | POA: Diagnosis not present

## 2017-03-27 DIAGNOSIS — M21612 Bunion of left foot: Secondary | ICD-10-CM | POA: Diagnosis not present

## 2017-03-27 DIAGNOSIS — M21611 Bunion of right foot: Secondary | ICD-10-CM | POA: Diagnosis not present

## 2017-03-27 DIAGNOSIS — M79672 Pain in left foot: Secondary | ICD-10-CM | POA: Diagnosis not present

## 2017-03-27 DIAGNOSIS — M25571 Pain in right ankle and joints of right foot: Secondary | ICD-10-CM | POA: Diagnosis not present

## 2017-03-27 DIAGNOSIS — M79671 Pain in right foot: Secondary | ICD-10-CM | POA: Diagnosis not present

## 2017-03-27 DIAGNOSIS — M7751 Other enthesopathy of right foot: Secondary | ICD-10-CM | POA: Diagnosis not present

## 2017-04-17 DIAGNOSIS — N859 Noninflammatory disorder of uterus, unspecified: Secondary | ICD-10-CM | POA: Diagnosis not present

## 2017-04-17 DIAGNOSIS — D250 Submucous leiomyoma of uterus: Secondary | ICD-10-CM | POA: Diagnosis not present

## 2017-04-17 DIAGNOSIS — N840 Polyp of corpus uteri: Secondary | ICD-10-CM | POA: Diagnosis not present

## 2017-04-17 DIAGNOSIS — N939 Abnormal uterine and vaginal bleeding, unspecified: Secondary | ICD-10-CM | POA: Diagnosis not present

## 2017-05-01 DIAGNOSIS — D126 Benign neoplasm of colon, unspecified: Secondary | ICD-10-CM | POA: Diagnosis not present

## 2017-05-01 DIAGNOSIS — K635 Polyp of colon: Secondary | ICD-10-CM | POA: Diagnosis not present

## 2017-05-01 DIAGNOSIS — K5730 Diverticulosis of large intestine without perforation or abscess without bleeding: Secondary | ICD-10-CM | POA: Diagnosis not present

## 2017-05-01 DIAGNOSIS — Z86010 Personal history of colonic polyps: Secondary | ICD-10-CM | POA: Diagnosis not present

## 2017-05-08 DIAGNOSIS — K635 Polyp of colon: Secondary | ICD-10-CM | POA: Diagnosis not present

## 2017-05-08 DIAGNOSIS — D126 Benign neoplasm of colon, unspecified: Secondary | ICD-10-CM | POA: Diagnosis not present

## 2017-07-06 ENCOUNTER — Other Ambulatory Visit: Payer: Self-pay | Admitting: Family Medicine

## 2017-07-06 DIAGNOSIS — Z1231 Encounter for screening mammogram for malignant neoplasm of breast: Secondary | ICD-10-CM

## 2017-08-05 DIAGNOSIS — L258 Unspecified contact dermatitis due to other agents: Secondary | ICD-10-CM | POA: Diagnosis not present

## 2017-08-05 DIAGNOSIS — Z1283 Encounter for screening for malignant neoplasm of skin: Secondary | ICD-10-CM | POA: Diagnosis not present

## 2017-08-12 ENCOUNTER — Ambulatory Visit
Admission: RE | Admit: 2017-08-12 | Discharge: 2017-08-12 | Disposition: A | Discharge status: Home / Self Care | Payer: BLUE CROSS/BLUE SHIELD | Source: Ambulatory Visit | Attending: Family Medicine | Admitting: Family Medicine

## 2017-08-12 ENCOUNTER — Encounter: Payer: Self-pay | Admitting: Radiology

## 2017-08-12 DIAGNOSIS — Z1231 Encounter for screening mammogram for malignant neoplasm of breast: Secondary | ICD-10-CM

## 2017-09-03 DIAGNOSIS — M940 Chondrocostal junction syndrome [Tietze]: Secondary | ICD-10-CM | POA: Diagnosis not present

## 2017-09-03 DIAGNOSIS — R0781 Pleurodynia: Secondary | ICD-10-CM | POA: Diagnosis not present

## 2017-09-11 DIAGNOSIS — Z01419 Encounter for gynecological examination (general) (routine) without abnormal findings: Secondary | ICD-10-CM | POA: Diagnosis not present

## 2017-09-11 DIAGNOSIS — Z6828 Body mass index (BMI) 28.0-28.9, adult: Secondary | ICD-10-CM | POA: Diagnosis not present

## 2017-11-10 DIAGNOSIS — N939 Abnormal uterine and vaginal bleeding, unspecified: Secondary | ICD-10-CM | POA: Diagnosis not present

## 2017-11-10 DIAGNOSIS — Z6828 Body mass index (BMI) 28.0-28.9, adult: Secondary | ICD-10-CM | POA: Diagnosis not present

## 2017-11-10 DIAGNOSIS — N926 Irregular menstruation, unspecified: Secondary | ICD-10-CM | POA: Diagnosis not present

## 2018-01-01 DIAGNOSIS — M21611 Bunion of right foot: Secondary | ICD-10-CM | POA: Diagnosis not present

## 2018-01-01 DIAGNOSIS — M21612 Bunion of left foot: Secondary | ICD-10-CM | POA: Diagnosis not present

## 2018-01-01 DIAGNOSIS — M25571 Pain in right ankle and joints of right foot: Secondary | ICD-10-CM | POA: Diagnosis not present

## 2018-01-25 DIAGNOSIS — N390 Urinary tract infection, site not specified: Secondary | ICD-10-CM | POA: Diagnosis not present

## 2018-01-25 DIAGNOSIS — J069 Acute upper respiratory infection, unspecified: Secondary | ICD-10-CM | POA: Diagnosis not present

## 2018-02-26 DIAGNOSIS — J209 Acute bronchitis, unspecified: Secondary | ICD-10-CM | POA: Diagnosis not present

## 2018-02-26 DIAGNOSIS — J301 Allergic rhinitis due to pollen: Secondary | ICD-10-CM | POA: Diagnosis not present

## 2018-04-11 HISTORY — PX: OTHER SURGICAL HISTORY: SHX169

## 2018-04-30 DIAGNOSIS — N898 Other specified noninflammatory disorders of vagina: Secondary | ICD-10-CM | POA: Diagnosis not present

## 2018-04-30 DIAGNOSIS — Z6827 Body mass index (BMI) 27.0-27.9, adult: Secondary | ICD-10-CM | POA: Diagnosis not present

## 2018-06-09 DIAGNOSIS — Z13220 Encounter for screening for lipoid disorders: Secondary | ICD-10-CM | POA: Diagnosis not present

## 2018-06-09 DIAGNOSIS — R5383 Other fatigue: Secondary | ICD-10-CM | POA: Diagnosis not present

## 2018-06-09 DIAGNOSIS — K219 Gastro-esophageal reflux disease without esophagitis: Secondary | ICD-10-CM | POA: Diagnosis not present

## 2018-06-09 DIAGNOSIS — Z0000 Encounter for general adult medical examination without abnormal findings: Secondary | ICD-10-CM | POA: Diagnosis not present

## 2018-06-09 DIAGNOSIS — J301 Allergic rhinitis due to pollen: Secondary | ICD-10-CM | POA: Diagnosis not present

## 2018-06-24 DIAGNOSIS — H52223 Regular astigmatism, bilateral: Secondary | ICD-10-CM | POA: Diagnosis not present

## 2018-06-24 DIAGNOSIS — H5203 Hypermetropia, bilateral: Secondary | ICD-10-CM | POA: Diagnosis not present

## 2018-06-24 DIAGNOSIS — H524 Presbyopia: Secondary | ICD-10-CM | POA: Diagnosis not present

## 2018-07-02 DIAGNOSIS — Z136 Encounter for screening for cardiovascular disorders: Secondary | ICD-10-CM | POA: Diagnosis not present

## 2018-07-02 DIAGNOSIS — R5383 Other fatigue: Secondary | ICD-10-CM | POA: Diagnosis not present

## 2018-07-02 DIAGNOSIS — Z0000 Encounter for general adult medical examination without abnormal findings: Secondary | ICD-10-CM | POA: Diagnosis not present

## 2018-07-05 ENCOUNTER — Other Ambulatory Visit: Payer: Self-pay | Admitting: Family Medicine

## 2018-07-05 DIAGNOSIS — Z1231 Encounter for screening mammogram for malignant neoplasm of breast: Secondary | ICD-10-CM

## 2018-07-16 ENCOUNTER — Ambulatory Visit: Payer: Self-pay | Admitting: Allergy

## 2018-08-10 DIAGNOSIS — N3000 Acute cystitis without hematuria: Secondary | ICD-10-CM | POA: Diagnosis not present

## 2018-08-10 DIAGNOSIS — R3915 Urgency of urination: Secondary | ICD-10-CM | POA: Diagnosis not present

## 2018-10-01 ENCOUNTER — Ambulatory Visit: Payer: Self-pay | Admitting: Allergy

## 2018-10-08 ENCOUNTER — Ambulatory Visit
Admission: RE | Admit: 2018-10-08 | Discharge: 2018-10-08 | Disposition: A | Discharge status: Home / Self Care | Payer: BLUE CROSS/BLUE SHIELD | Source: Ambulatory Visit | Attending: Family Medicine | Admitting: Family Medicine

## 2018-10-08 ENCOUNTER — Other Ambulatory Visit: Payer: Self-pay

## 2018-10-08 DIAGNOSIS — Z1231 Encounter for screening mammogram for malignant neoplasm of breast: Secondary | ICD-10-CM

## 2018-10-15 DIAGNOSIS — M5136 Other intervertebral disc degeneration, lumbar region: Secondary | ICD-10-CM | POA: Diagnosis not present

## 2018-10-15 DIAGNOSIS — M542 Cervicalgia: Secondary | ICD-10-CM | POA: Diagnosis not present

## 2018-10-15 DIAGNOSIS — M546 Pain in thoracic spine: Secondary | ICD-10-CM | POA: Diagnosis not present

## 2018-10-22 ENCOUNTER — Ambulatory Visit: Payer: BLUE CROSS/BLUE SHIELD | Admitting: Cardiology

## 2018-10-22 ENCOUNTER — Encounter

## 2018-10-27 DIAGNOSIS — R319 Hematuria, unspecified: Secondary | ICD-10-CM | POA: Diagnosis not present

## 2018-10-27 DIAGNOSIS — N939 Abnormal uterine and vaginal bleeding, unspecified: Secondary | ICD-10-CM | POA: Diagnosis not present

## 2018-10-27 DIAGNOSIS — Z6829 Body mass index (BMI) 29.0-29.9, adult: Secondary | ICD-10-CM | POA: Diagnosis not present

## 2018-10-29 DIAGNOSIS — K219 Gastro-esophageal reflux disease without esophagitis: Secondary | ICD-10-CM | POA: Diagnosis not present

## 2018-10-29 DIAGNOSIS — J301 Allergic rhinitis due to pollen: Secondary | ICD-10-CM | POA: Diagnosis not present

## 2018-12-10 ENCOUNTER — Ambulatory Visit: Payer: Self-pay | Admitting: Allergy

## 2019-03-18 DIAGNOSIS — R319 Hematuria, unspecified: Secondary | ICD-10-CM | POA: Diagnosis not present

## 2019-04-13 DIAGNOSIS — Z6829 Body mass index (BMI) 29.0-29.9, adult: Secondary | ICD-10-CM | POA: Diagnosis not present

## 2019-04-13 DIAGNOSIS — R9389 Abnormal findings on diagnostic imaging of other specified body structures: Secondary | ICD-10-CM | POA: Diagnosis not present

## 2019-04-22 DIAGNOSIS — M799 Soft tissue disorder, unspecified: Secondary | ICD-10-CM | POA: Diagnosis not present

## 2019-04-22 DIAGNOSIS — J301 Allergic rhinitis due to pollen: Secondary | ICD-10-CM | POA: Diagnosis not present

## 2019-04-22 DIAGNOSIS — K219 Gastro-esophageal reflux disease without esophagitis: Secondary | ICD-10-CM | POA: Diagnosis not present

## 2019-05-09 DIAGNOSIS — M5136 Other intervertebral disc degeneration, lumbar region: Secondary | ICD-10-CM | POA: Diagnosis not present

## 2019-05-11 DIAGNOSIS — R3121 Asymptomatic microscopic hematuria: Secondary | ICD-10-CM | POA: Diagnosis not present

## 2019-05-27 DIAGNOSIS — R3129 Other microscopic hematuria: Secondary | ICD-10-CM | POA: Diagnosis not present

## 2019-05-27 DIAGNOSIS — D259 Leiomyoma of uterus, unspecified: Secondary | ICD-10-CM | POA: Diagnosis not present

## 2019-05-27 DIAGNOSIS — R3121 Asymptomatic microscopic hematuria: Secondary | ICD-10-CM | POA: Diagnosis not present

## 2019-05-31 ENCOUNTER — Other Ambulatory Visit: Payer: Self-pay | Admitting: Urology

## 2019-05-31 DIAGNOSIS — R3121 Asymptomatic microscopic hematuria: Secondary | ICD-10-CM | POA: Diagnosis not present

## 2019-05-31 DIAGNOSIS — D414 Neoplasm of uncertain behavior of bladder: Secondary | ICD-10-CM | POA: Diagnosis not present

## 2019-06-07 ENCOUNTER — Encounter (HOSPITAL_BASED_OUTPATIENT_CLINIC_OR_DEPARTMENT_OTHER): Payer: Self-pay | Admitting: Urology

## 2019-06-07 ENCOUNTER — Other Ambulatory Visit: Payer: Self-pay

## 2019-06-07 NOTE — Progress Notes (Signed)
Spoke w/ via phone for pre-op interview---[name] needs dos----  NONE            [name] results------ COVID test ------06-11-2019 Arrive at -------700 AM 06-13-2019 NPO after ------MIDNIGHT Medications to take morning of surgery -----OMEPRAZOLE Diabetic medication -----N/A Patient Special Instructions ----- Pre-Op special Istructions ----- Patient verbalized understanding of instructions that were given at this phone interview. Patient denies shortness of breath, chest pain, fever, cough a this phone interview.

## 2019-06-11 ENCOUNTER — Other Ambulatory Visit (HOSPITAL_COMMUNITY)
Admission: RE | Admit: 2019-06-11 | Discharge: 2019-06-11 | Disposition: A | Discharge status: Home / Self Care | Payer: BLUE CROSS/BLUE SHIELD | Source: Ambulatory Visit | Attending: Urology | Admitting: Urology

## 2019-06-11 DIAGNOSIS — Z20822 Contact with and (suspected) exposure to covid-19: Secondary | ICD-10-CM | POA: Insufficient documentation

## 2019-06-11 DIAGNOSIS — Z01812 Encounter for preprocedural laboratory examination: Secondary | ICD-10-CM | POA: Diagnosis not present

## 2019-06-11 LAB — SARS CORONAVIRUS 2 (TAT 6-24 HRS): SARS Coronavirus 2: NEGATIVE

## 2019-06-12 NOTE — Anesthesia Preprocedure Evaluation (Addendum)
Anesthesia Evaluation  Patient identified by MRN, date of birth, ID band Patient awake    Reviewed: Allergy & Precautions, NPO status , Patient's Chart, lab work & pertinent test results  History of Anesthesia Complications Negative for: history of anesthetic complications  Airway Mallampati: II  TM Distance: >3 FB Neck ROM: Full    Dental no notable dental hx. (+) Dental Advisory Given   Pulmonary neg pulmonary ROS,    Pulmonary exam normal        Cardiovascular negative cardio ROS Normal cardiovascular exam     Neuro/Psych PSYCHIATRIC DISORDERS Anxiety Depression negative neurological ROS     GI/Hepatic Neg liver ROS, GERD  Medicated,  Endo/Other  negative endocrine ROS  Renal/GU negative Renal ROS     Musculoskeletal negative musculoskeletal ROS (+)   Abdominal   Peds  Hematology negative hematology ROS (+)   Anesthesia Other Findings Day of surgery medications reviewed with the patient.  Reproductive/Obstetrics                            Anesthesia Physical Anesthesia Plan  ASA: II  Anesthesia Plan: General   Post-op Pain Management:    Induction: Intravenous  PONV Risk Score and Plan: 4 or greater and Ondansetron, Dexamethasone, Midazolam and Scopolamine patch - Pre-op  Airway Management Planned: LMA  Additional Equipment:   Intra-op Plan:   Post-operative Plan: Extubation in OR  Informed Consent: I have reviewed the patients History and Physical, chart, labs and discussed the procedure including the risks, benefits and alternatives for the proposed anesthesia with the patient or authorized representative who has indicated his/her understanding and acceptance.     Dental advisory given  Plan Discussed with: Anesthesiologist and CRNA  Anesthesia Plan Comments:        Anesthesia Quick Evaluation

## 2019-06-13 ENCOUNTER — Ambulatory Visit (HOSPITAL_BASED_OUTPATIENT_CLINIC_OR_DEPARTMENT_OTHER): Payer: BLUE CROSS/BLUE SHIELD | Admitting: Anesthesiology

## 2019-06-13 ENCOUNTER — Encounter (HOSPITAL_BASED_OUTPATIENT_CLINIC_OR_DEPARTMENT_OTHER): Payer: Self-pay | Admitting: Urology

## 2019-06-13 ENCOUNTER — Ambulatory Visit (HOSPITAL_BASED_OUTPATIENT_CLINIC_OR_DEPARTMENT_OTHER)
Admission: RE | Admit: 2019-06-13 | Discharge: 2019-06-13 | Disposition: A | Discharge status: Home / Self Care | Payer: BLUE CROSS/BLUE SHIELD | Attending: Urology | Admitting: Urology

## 2019-06-13 ENCOUNTER — Encounter (HOSPITAL_BASED_OUTPATIENT_CLINIC_OR_DEPARTMENT_OTHER)
Admission: RE | Disposition: A | Discharge status: Home / Self Care | Payer: Self-pay | Source: Home / Self Care | Attending: Urology

## 2019-06-13 DIAGNOSIS — D414 Neoplasm of uncertain behavior of bladder: Secondary | ICD-10-CM | POA: Diagnosis not present

## 2019-06-13 DIAGNOSIS — J309 Allergic rhinitis, unspecified: Secondary | ICD-10-CM | POA: Diagnosis not present

## 2019-06-13 DIAGNOSIS — D500 Iron deficiency anemia secondary to blood loss (chronic): Secondary | ICD-10-CM | POA: Diagnosis not present

## 2019-06-13 DIAGNOSIS — K219 Gastro-esophageal reflux disease without esophagitis: Secondary | ICD-10-CM | POA: Diagnosis not present

## 2019-06-13 DIAGNOSIS — C679 Malignant neoplasm of bladder, unspecified: Secondary | ICD-10-CM | POA: Diagnosis not present

## 2019-06-13 DIAGNOSIS — D494 Neoplasm of unspecified behavior of bladder: Secondary | ICD-10-CM | POA: Diagnosis not present

## 2019-06-13 DIAGNOSIS — F418 Other specified anxiety disorders: Secondary | ICD-10-CM | POA: Diagnosis not present

## 2019-06-13 HISTORY — PX: TRANSURETHRAL RESECTION OF BLADDER TUMOR WITH MITOMYCIN-C: SHX6459

## 2019-06-13 HISTORY — DX: Neoplasm of unspecified behavior of bladder: D49.4

## 2019-06-13 HISTORY — DX: Dermatitis, unspecified: L30.9

## 2019-06-13 HISTORY — DX: Other complications of anesthesia, initial encounter: T88.59XA

## 2019-06-13 SURGERY — TRANSURETHRAL RESECTION OF BLADDER TUMOR WITH MITOMYCIN-C
Anesthesia: General | Site: Bladder

## 2019-06-13 MED ORDER — ROCURONIUM BROMIDE 100 MG/10ML IV SOLN
INTRAVENOUS | Status: DC | PRN
  Administered 2019-06-13: 30 mg via INTRAVENOUS

## 2019-06-13 MED ORDER — CELECOXIB 400 MG PO CAPS
400.0000 mg | ORAL_CAPSULE | Freq: Once | ORAL | Status: AC
  Administered 2019-06-13: 400 mg via ORAL
  Filled 2019-06-13: qty 1

## 2019-06-13 MED ORDER — DEXAMETHASONE SODIUM PHOSPHATE 10 MG/ML IJ SOLN
INTRAMUSCULAR | Status: DC | PRN
  Administered 2019-06-13: 8 mg via INTRAVENOUS

## 2019-06-13 MED ORDER — FENTANYL CITRATE (PF) 100 MCG/2ML IJ SOLN
25.0000 ug | INTRAMUSCULAR | Status: DC | PRN
  Administered 2019-06-13: 25 ug via INTRAVENOUS
  Filled 2019-06-13: qty 1

## 2019-06-13 MED ORDER — FENTANYL CITRATE (PF) 100 MCG/2ML IJ SOLN
INTRAMUSCULAR | Status: AC
  Filled 2019-06-13: qty 2

## 2019-06-13 MED ORDER — BELLADONNA ALKALOIDS-OPIUM 16.2-60 MG RE SUPP
RECTAL | Status: AC
  Filled 2019-06-13: qty 1

## 2019-06-13 MED ORDER — GEMCITABINE CHEMO FOR BLADDER INSTILLATION 2000 MG
2000.0000 mg | Freq: Once | INTRAVENOUS | Status: AC
  Administered 2019-06-13: 2000 mg via INTRAVESICAL
  Filled 2019-06-13: qty 2000

## 2019-06-13 MED ORDER — LIDOCAINE 2% (20 MG/ML) 5 ML SYRINGE
INTRAMUSCULAR | Status: AC
  Filled 2019-06-13: qty 5

## 2019-06-13 MED ORDER — ACETAMINOPHEN 500 MG PO TABS
ORAL_TABLET | ORAL | Status: AC
  Filled 2019-06-13: qty 2

## 2019-06-13 MED ORDER — OXYCODONE HCL 5 MG PO TABS
5.0000 mg | ORAL_TABLET | Freq: Once | ORAL | Status: AC
  Administered 2019-06-13: 5 mg via ORAL
  Filled 2019-06-13: qty 1

## 2019-06-13 MED ORDER — PROPOFOL 10 MG/ML IV BOLUS
INTRAVENOUS | Status: AC
  Filled 2019-06-13: qty 20

## 2019-06-13 MED ORDER — CELECOXIB 200 MG PO CAPS
ORAL_CAPSULE | ORAL | Status: AC
  Filled 2019-06-13: qty 2

## 2019-06-13 MED ORDER — GEMCITABINE CHEMO FOR BLADDER INSTILLATION 2000 MG: 2000.0000 mg | Freq: Once | INTRAVENOUS | Status: DC

## 2019-06-13 MED ORDER — SUGAMMADEX SODIUM 200 MG/2ML IV SOLN
INTRAVENOUS | Status: DC | PRN
  Administered 2019-06-13: 170 mg via INTRAVENOUS

## 2019-06-13 MED ORDER — ONDANSETRON HCL 4 MG/2ML IJ SOLN
INTRAMUSCULAR | Status: DC | PRN
  Administered 2019-06-13: 4 mg via INTRAVENOUS

## 2019-06-13 MED ORDER — PROMETHAZINE HCL 25 MG/ML IJ SOLN
6.2500 mg | INTRAMUSCULAR | Status: DC | PRN
  Filled 2019-06-13: qty 1

## 2019-06-13 MED ORDER — ACETAMINOPHEN 500 MG PO TABS
1000.0000 mg | ORAL_TABLET | Freq: Once | ORAL | Status: AC
  Administered 2019-06-13: 1000 mg via ORAL
  Filled 2019-06-13: qty 2

## 2019-06-13 MED ORDER — LACTATED RINGERS IV SOLN
INTRAVENOUS | Status: DC
  Filled 2019-06-13: qty 1000

## 2019-06-13 MED ORDER — LIDOCAINE 2% (20 MG/ML) 5 ML SYRINGE
INTRAMUSCULAR | Status: DC | PRN
  Administered 2019-06-13: 80 mg via INTRAVENOUS

## 2019-06-13 MED ORDER — DEXAMETHASONE SODIUM PHOSPHATE 10 MG/ML IJ SOLN
INTRAMUSCULAR | Status: AC
  Filled 2019-06-13: qty 1

## 2019-06-13 MED ORDER — PROPOFOL 10 MG/ML IV BOLUS
INTRAVENOUS | Status: DC | PRN
  Administered 2019-06-13: 160 mg via INTRAVENOUS

## 2019-06-13 MED ORDER — SCOPOLAMINE 1 MG/3DAYS TD PT72
1.0000 | MEDICATED_PATCH | TRANSDERMAL | Status: DC
  Administered 2019-06-13: 1.5 mg via TRANSDERMAL
  Filled 2019-06-13: qty 1

## 2019-06-13 MED ORDER — ONDANSETRON HCL 4 MG/2ML IJ SOLN
INTRAMUSCULAR | Status: AC
  Filled 2019-06-13: qty 2

## 2019-06-13 MED ORDER — EPHEDRINE 5 MG/ML INJ
INTRAVENOUS | Status: AC
  Filled 2019-06-13: qty 10

## 2019-06-13 MED ORDER — EPHEDRINE SULFATE-NACL 50-0.9 MG/10ML-% IV SOSY
PREFILLED_SYRINGE | INTRAVENOUS | Status: DC | PRN
  Administered 2019-06-13: 5 mg via INTRAVENOUS

## 2019-06-13 MED ORDER — HYDROCODONE-ACETAMINOPHEN 5-325 MG PO TABS: 1.0000 | ORAL_TABLET | ORAL | 0 refills | Status: DC | PRN

## 2019-06-13 MED ORDER — SUCCINYLCHOLINE CHLORIDE 20 MG/ML IJ SOLN
INTRAMUSCULAR | Status: DC | PRN
  Administered 2019-06-13: 120 mg via INTRAVENOUS

## 2019-06-13 MED ORDER — SCOPOLAMINE 1 MG/3DAYS TD PT72
MEDICATED_PATCH | TRANSDERMAL | Status: AC
  Filled 2019-06-13: qty 1

## 2019-06-13 MED ORDER — SODIUM CHLORIDE 0.9 % IR SOLN
Status: DC | PRN
  Administered 2019-06-13: 3000 mL

## 2019-06-13 MED ORDER — FENTANYL CITRATE (PF) 100 MCG/2ML IJ SOLN
INTRAMUSCULAR | Status: DC | PRN
  Administered 2019-06-13: 50 ug via INTRAVENOUS

## 2019-06-13 MED ORDER — ROCURONIUM BROMIDE 10 MG/ML (PF) SYRINGE
PREFILLED_SYRINGE | INTRAVENOUS | Status: AC
  Filled 2019-06-13: qty 10

## 2019-06-13 MED ORDER — CEFAZOLIN SODIUM-DEXTROSE 2-4 GM/100ML-% IV SOLN
2.0000 g | INTRAVENOUS | Status: AC
  Administered 2019-06-13: 2 g via INTRAVENOUS
  Filled 2019-06-13: qty 100

## 2019-06-13 MED ORDER — SUCCINYLCHOLINE CHLORIDE 200 MG/10ML IV SOSY
PREFILLED_SYRINGE | INTRAVENOUS | Status: AC
  Filled 2019-06-13: qty 10

## 2019-06-13 MED ORDER — CEFAZOLIN SODIUM-DEXTROSE 2-4 GM/100ML-% IV SOLN
INTRAVENOUS | Status: AC
  Filled 2019-06-13: qty 100

## 2019-06-13 MED ORDER — OXYCODONE HCL 5 MG PO TABS
ORAL_TABLET | ORAL | Status: AC
  Filled 2019-06-13: qty 1

## 2019-06-13 SURGICAL SUPPLY — 23 items
BAG DRAIN URO-CYSTO SKYTR STRL (DRAIN) ×2 IMPLANT
BAG URINE DRAIN 2000ML AR STRL (UROLOGICAL SUPPLIES) ×2 IMPLANT
BAG URINE LEG 500ML (DRAIN) IMPLANT
CATH FOLEY 2WAY SLVR  5CC 18FR (CATHETERS) ×1
CATH FOLEY 2WAY SLVR  5CC 22FR (CATHETERS)
CATH FOLEY 2WAY SLVR 30CC 20FR (CATHETERS) IMPLANT
CATH FOLEY 2WAY SLVR 5CC 18FR (CATHETERS) ×1 IMPLANT
CATH FOLEY 2WAY SLVR 5CC 22FR (CATHETERS) IMPLANT
CLOTH BEACON ORANGE TIMEOUT ST (SAFETY) ×2 IMPLANT
ELECT REM PT RETURN 9FT ADLT (ELECTROSURGICAL) ×2
ELECTRODE REM PT RTRN 9FT ADLT (ELECTROSURGICAL) ×1 IMPLANT
EVACUATOR MICROVAS BLADDER (UROLOGICAL SUPPLIES) IMPLANT
GLOVE BIO SURGEON STRL SZ7.5 (GLOVE) ×2 IMPLANT
GOWN STRL REUS W/ TWL LRG LVL3 (GOWN DISPOSABLE) ×1 IMPLANT
GOWN STRL REUS W/TWL LRG LVL3 (GOWN DISPOSABLE) ×1
IV NS IRRIG 3000ML ARTHROMATIC (IV SOLUTION) ×2 IMPLANT
KIT TURNOVER CYSTO (KITS) ×2 IMPLANT
LOOP CUT BIPOLAR 24F LRG (ELECTROSURGICAL) ×2 IMPLANT
MANIFOLD NEPTUNE II (INSTRUMENTS) IMPLANT
PACK CYSTO (CUSTOM PROCEDURE TRAY) ×2 IMPLANT
SYR TOOMEY IRRIG 70ML (MISCELLANEOUS)
SYRINGE TOOMEY IRRIG 70ML (MISCELLANEOUS) IMPLANT
TUBE CONNECTING 12X1/4 (SUCTIONS) IMPLANT

## 2019-06-13 NOTE — H&P (Signed)
CC/HPI: CC: Bladder tumor  HPI:  05/31/2019  59 year old female referred by Dr. Matilde Sprang after a microscopic hematuria evaluation revealed a small bladder tumor. He performed a cystoscopy that showed a small papillary tumor. He obtained a CT IVP that re-demonstrated small bladder tumors x2. No upper tract abnormalities. She presents to discuss options.     ALLERGIES: None   MEDICATIONS: Acid Controller     GU PSH: Cystoscopy - 05/11/2019 Locm 300-399Mg /Ml Iodine,1Ml - 05/27/2019     NON-GU PSH: None   GU PMH: Microscopic hematuria - 05/11/2019    NON-GU PMH: None   FAMILY HISTORY: None   SOCIAL HISTORY: None   REVIEW OF SYSTEMS:    GU Review Female:   Patient reports frequent urination. Patient denies hard to postpone urination, burning /pain with urination, get up at night to urinate, leakage of urine, stream starts and stops, trouble starting your stream, have to strain to urinate, and being pregnant.  Gastrointestinal (Upper):   Patient denies nausea, vomiting, and indigestion/ heartburn.  Gastrointestinal (Lower):   Patient denies diarrhea and constipation.  Constitutional:   Patient denies fever, night sweats, weight loss, and fatigue.  Skin:   Patient denies skin rash/ lesion and itching.  Eyes:   Patient denies blurred vision and double vision.  Ears/ Nose/ Throat:   Patient denies sore throat and sinus problems.  Hematologic/Lymphatic:   Patient denies swollen glands and easy bruising.  Cardiovascular:   Patient denies leg swelling and chest pains.  Respiratory:   Patient denies cough and shortness of breath.  Endocrine:   Patient denies excessive thirst.  Musculoskeletal:   Patient denies back pain and joint pain.  Neurological:   Patient denies headaches and dizziness.  Psychologic:   Patient denies depression and anxiety.   VITAL SIGNS:      05/31/2019 08:01 AM  BP 111/74 mmHg  Heart Rate 60 /min  Temperature 96.9 F / 36.0 C   MULTI-SYSTEM PHYSICAL  EXAMINATION:    Constitutional: Well-nourished. No physical deformities. Normally developed. Good grooming.  Respiratory: No labored breathing, no use of accessory muscles.   Cardiovascular: Normal temperature, adequate perfusion of extremities  Skin: No paleness, no jaundice  Neurologic / Psychiatric: Oriented to time, oriented to place, oriented to person. No depression, no anxiety, no agitation.  Gastrointestinal: No mass, no tenderness, no rigidity, non obese abdomen.  Eyes: Normal conjunctivae. Normal eyelids.  Musculoskeletal: Normal gait and station of head and neck.     PAST DATA REVIEWED:  Source Of History:  Patient  Records Review:   Previous Doctor Records, Previous Patient Records  X-Ray Review: C.T. Abdomen/Pelvis: Reviewed Films. Reviewed Report. Discussed With Patient.     PROCEDURES:          Urinalysis Dipstick Dipstick Cont'd  Color: Yellow Bilirubin: Neg mg/dL  Appearance: Clear Ketones: Neg mg/dL  Specific Gravity: 1.025 Blood: Neg ery/uL  pH: <=5.0 Protein: Neg mg/dL  Glucose: Neg mg/dL Urobilinogen: 0.2 mg/dL    Nitrites: Neg    Leukocyte Esterase: Neg leu/uL    ASSESSMENT:      ICD-10 Details  1 GU:   Bladder tumor/neoplasm - D41.4 Acute, Uncomplicated  2   Microscopic hematuria - R31.21 Chronic, Stable   PLAN:           Schedule Procedure: Unspecified Date - Cystoscopy TURBT <2 cm - [id number]          Document Letter(s):  Created for Patient: Clinical Summary         Notes:  Proceed with TURBT with instillation of gemcitabine. Risks and benefits discussed including but not limited to bleeding, infection, injury to surrounding structures, bladder perforation, need for additional procedures.   CC: Ariana Rob, MD    Signed by Link Snuffer, III, M.D. on 05/31/19 at 8:34 AM (EST

## 2019-06-13 NOTE — Op Note (Signed)
Operative Note  Preoperative diagnosis:  1.  Bladder tumor  Postoperative diagnosis: 1.  Bladder tumor--medium  Procedure(s): 1.  Transurethral resection of bladder tumor--medium 2.  Intravesical instillation of chemotherapy--gemcitabine  Surgeon: Link Snuffer, MD  Assistants: None  Anesthesia: General  Complications: None immediate  EBL: Minimal  Specimens: 1.  Bladder tumor  Drains/Catheters: 1.  18 French Foley catheter  Intraoperative findings: 1.  Normal urethra 2.  Bilateral ureteral orifices normal. 3.  2 separate bladder tumors.  There was one on the posterior wall and another on the posterior lateral wall to the left.  Both were about 2 cm.  Indication: 59 year old female found to have a bladder tumor presents for the previously mentioned operation.  Description of procedure:  The patient was identified and consent was obtained.  The patient was taken to the operating room and placed in the supine position.  The patient was placed under general anesthesia.  Perioperative antibiotics were administered.  The patient was placed in dorsal lithotomy.  Patient was prepped and draped in a standard sterile fashion and a timeout was performed.  A 26 French resectoscope with the visual obturator in place was advanced into the urethra and into the bladder.  This was exchanged for the working element.  Complete cystoscopy was performed with findings noted above.  On bipolar settings, the tumor was completely resected down to muscle fibers.  The specimen was collected.  I fulgurated the resection beds.  There was no evidence of any perforation.  There were no other bladder tumors.  I therefore withdrew the scope and placed a Foley catheter.  Patient tolerated procedure well and was stable postoperatively.  In the PACU, intravesical gemcitabine was instilled.  This remained for approximately 1 hour and the patient underwent a voiding trial.  Plan: Follow-up in 1 week

## 2019-06-13 NOTE — Anesthesia Postprocedure Evaluation (Signed)
Anesthesia Post Note  Patient: [name]  Procedure(s) Performed: TRANSURETHRAL RESECTION OF BLADDER TUMOR WITH GEMCITABINE (N/A Bladder)     Patient location during evaluation: PACU Anesthesia Type: General Level of consciousness: sedated Pain management: pain level controlled Vital Signs Assessment: post-procedure vital signs reviewed and stable Respiratory status: spontaneous breathing and respiratory function stable Cardiovascular status: stable Postop Assessment: no apparent nausea or vomiting Anesthetic complications: no    Last Vitals:  Vitals:   06/13/19 1200 06/13/19 1215  BP:  113/71  Pulse: (!) 51 (!) 51  Resp: 16 12  Temp: 36.8 C   SpO2: 97% 97%    Last Pain:  Vitals:   06/13/19 1200  TempSrc:   PainSc: 2                  Julliette Frentz DANIEL

## 2019-06-13 NOTE — Transfer of Care (Signed)
Immediate Anesthesia Transfer of Care Note  Patient: Zitong Popko  Procedure(s) Performed: TRANSURETHRAL RESECTION OF BLADDER TUMOR WITH GEMCITABINE (N/A Bladder)  Patient Location: PACU  Anesthesia Type:General  Level of Consciousness: awake, alert , oriented and patient cooperative  Airway & Oxygen Therapy: Patient Spontanous Breathing and Patient connected to face mask oxygen  Post-op Assessment: Report given to RN, Post -op Vital signs reviewed and stable and Patient moving all extremities  Post vital signs: Reviewed and stable  Last Vitals:  Vitals Value Taken Time  BP 157/87 06/13/19 1002  Temp    Pulse 84 06/13/19 1006  Resp 18 06/13/19 1006  SpO2 100 % 06/13/19 1006  Vitals shown include unvalidated device data.  Last Pain:  Vitals:   06/13/19 0740  TempSrc: Oral  PainSc: 0-No pain      Patients Stated Pain Goal: 4 ([phone] [id number])  Complications: No apparent anesthesia complications

## 2019-06-13 NOTE — Discharge Instructions (Addendum)
Transurethral Resection of Bladder Tumor (TURBT) or Bladder Biopsy ° ° °Definition: ° Transurethral Resection of the Bladder Tumor is a surgical procedure used to diagnose and remove tumors within the bladder. TURBT is the most common treatment for early stage bladder cancer. ° °General instructions: °   ° Your recent bladder surgery requires very little post hospital care but some definite precautions. ° °Despite the fact that no skin incisions were used, the area around the bladder incisions are raw and covered with scabs to promote healing and prevent bleeding. Certain precautions are needed to insure that the scabs are not disturbed over the next 2-4 weeks while the healing proceeds. ° °Because the raw surface inside your bladder and the irritating effects of urine you may expect frequency of urination and/or urgency (a stronger desire to urinate) and perhaps even getting up at night more often. This will usually resolve or improve slowly over the healing period. You may see some blood in your urine over the first 6 weeks. Do not be alarmed, even if the urine was clear for a while. Get off your feet and drink lots of fluids until clearing occurs. If you start to pass clots or don't improve call us. ° °Diet: ° °You may return to your normal diet immediately. Because of the raw surface of your bladder, alcohol, spicy foods, foods high in acid and drinks with caffeine may cause irritation or frequency and should be used in moderation. To keep your urine flowing freely and avoid constipation, drink plenty of fluids during the day (8-10 glasses). Tip: Avoid cranberry juice because it is very acidic. ° °Activity: ° °Your physical activity doesn't need to be restricted. However, if you are very active, you may see some blood in the urine. We suggest that you reduce your activity under the circumstances until the bleeding has stopped. ° °Bowels: ° °It is important to keep your bowels regular during the postoperative  period. Straining with bowel movements can cause bleeding. A bowel movement every other day is reasonable. Use a mild laxative if needed, such as milk of magnesia 2-3 tablespoons, or 2 Dulcolax tablets. Call if you continue to have problems. If you had been taking narcotics for pain, before, during or after your surgery, you may be constipated. Take a laxative if necessary. ° ° ° °Medication: ° °You should resume your pre-surgery medications unless told not to. In addition you may be given an antibiotic to prevent or treat infection. Antibiotics are not always necessary. All medication should be taken as prescribed until the bottles are finished unless you are having an unusual reaction to one of the drugs. ° ° ° ° ° °Post Anesthesia Home Care Instructions ° °Activity: °Get plenty of rest for the remainder of the day. A responsible individual must stay with you for 24 hours following the procedure.  °For the next 24 hours, DO NOT: °-Drive a car °-Operate machinery °-Drink alcoholic beverages °-Take any medication unless instructed by your physician °-Make any legal decisions or sign important papers. ° °Meals: °Start with liquid foods such as gelatin or soup. Progress to regular foods as tolerated. Avoid greasy, spicy, heavy foods. If nausea and/or vomiting occur, drink only clear liquids until the nausea and/or vomiting subsides. Call your physician if vomiting continues. ° °Special Instructions/Symptoms: °Your throat may feel dry or sore from the anesthesia or the breathing tube placed in your throat during surgery. If this causes discomfort, gargle with warm salt water. The discomfort should disappear within 24   hours. ° °If you had a scopolamine patch placed behind your ear for the management of post- operative nausea and/or vomiting: ° °1. The medication in the patch is effective for 72 hours, after which it should be removed.  Wrap patch in a tissue and discard in the trash. Wash hands thoroughly with soap and  water. °2. You may remove the patch earlier than 72 hours if you experience unpleasant side effects which may include dry mouth, dizziness or visual disturbances. °3. Avoid touching the patch. Wash your hands with soap and water after contact with the patch. °   ° ° ° °

## 2019-06-13 NOTE — Anesthesia Procedure Notes (Signed)
Procedure Name: Intubation Date/Time: 06/13/2019 9:19 AM Performed by: Victoriano Lain, CRNA Pre-anesthesia Checklist: Patient identified, Emergency Drugs available, Suction available, Patient being monitored and Timeout performed Patient Re-evaluated:Patient Re-evaluated prior to induction Oxygen Delivery Method: Circle system utilized Preoxygenation: Pre-oxygenation with 100% oxygen Induction Type: IV induction Ventilation: Mask ventilation without difficulty Laryngoscope Size: Mac and 4 Grade View: Grade I Tube type: Oral Tube size: 7.5 mm Number of attempts: 1 Airway Equipment and Method: Stylet Placement Confirmation: ETT inserted through vocal cords under direct vision,  positive ETCO2 and breath sounds checked- equal and bilateral Secured at: 21 cm Tube secured with: Tape Dental Injury: Teeth and Oropharynx as per pre-operative assessment

## 2019-06-14 LAB — SURGICAL PATHOLOGY

## 2019-06-20 DIAGNOSIS — D414 Neoplasm of uncertain behavior of bladder: Secondary | ICD-10-CM | POA: Diagnosis not present

## 2019-08-25 DIAGNOSIS — H5203 Hypermetropia, bilateral: Secondary | ICD-10-CM | POA: Diagnosis not present

## 2019-08-25 DIAGNOSIS — H52203 Unspecified astigmatism, bilateral: Secondary | ICD-10-CM | POA: Diagnosis not present

## 2019-08-25 DIAGNOSIS — H524 Presbyopia: Secondary | ICD-10-CM | POA: Diagnosis not present

## 2019-09-02 ENCOUNTER — Other Ambulatory Visit: Payer: Self-pay | Admitting: Family Medicine

## 2019-09-02 DIAGNOSIS — Z13220 Encounter for screening for lipoid disorders: Secondary | ICD-10-CM | POA: Diagnosis not present

## 2019-09-02 DIAGNOSIS — Z1231 Encounter for screening mammogram for malignant neoplasm of breast: Secondary | ICD-10-CM

## 2019-09-02 DIAGNOSIS — Z0000 Encounter for general adult medical examination without abnormal findings: Secondary | ICD-10-CM | POA: Diagnosis not present

## 2019-09-02 DIAGNOSIS — R5383 Other fatigue: Secondary | ICD-10-CM | POA: Diagnosis not present

## 2019-09-05 ENCOUNTER — Other Ambulatory Visit: Payer: Self-pay | Admitting: Family Medicine

## 2019-09-05 DIAGNOSIS — E2839 Other primary ovarian failure: Secondary | ICD-10-CM

## 2019-09-29 DIAGNOSIS — D414 Neoplasm of uncertain behavior of bladder: Secondary | ICD-10-CM | POA: Diagnosis not present

## 2019-09-29 DIAGNOSIS — R3915 Urgency of urination: Secondary | ICD-10-CM | POA: Diagnosis not present

## 2019-09-29 DIAGNOSIS — R350 Frequency of micturition: Secondary | ICD-10-CM | POA: Diagnosis not present

## 2019-10-14 ENCOUNTER — Ambulatory Visit
Admission: RE | Admit: 2019-10-14 | Discharge: 2019-10-14 | Disposition: A | Discharge status: Home / Self Care | Payer: BLUE CROSS/BLUE SHIELD | Source: Ambulatory Visit | Attending: Family Medicine | Admitting: Family Medicine

## 2019-10-14 ENCOUNTER — Other Ambulatory Visit: Payer: Self-pay

## 2019-10-14 ENCOUNTER — Ambulatory Visit: Payer: BLUE CROSS/BLUE SHIELD

## 2019-10-14 DIAGNOSIS — Z1231 Encounter for screening mammogram for malignant neoplasm of breast: Secondary | ICD-10-CM

## 2019-10-28 DIAGNOSIS — N841 Polyp of cervix uteri: Secondary | ICD-10-CM | POA: Diagnosis not present

## 2019-10-28 DIAGNOSIS — Z6829 Body mass index (BMI) 29.0-29.9, adult: Secondary | ICD-10-CM | POA: Diagnosis not present

## 2019-10-28 DIAGNOSIS — R9389 Abnormal findings on diagnostic imaging of other specified body structures: Secondary | ICD-10-CM | POA: Diagnosis not present

## 2019-11-25 ENCOUNTER — Inpatient Hospital Stay: Admission: RE | Admit: 2019-11-25 | Payer: BLUE CROSS/BLUE SHIELD | Source: Ambulatory Visit

## 2019-12-23 ENCOUNTER — Other Ambulatory Visit: Payer: Self-pay

## 2019-12-23 ENCOUNTER — Encounter: Payer: Self-pay | Admitting: Podiatry

## 2019-12-23 ENCOUNTER — Ambulatory Visit (INDEPENDENT_AMBULATORY_CARE_PROVIDER_SITE_OTHER): Payer: BLUE CROSS/BLUE SHIELD

## 2019-12-23 ENCOUNTER — Ambulatory Visit: Payer: BLUE CROSS/BLUE SHIELD | Admitting: Podiatry

## 2019-12-23 ENCOUNTER — Other Ambulatory Visit: Payer: Self-pay | Admitting: Podiatry

## 2019-12-23 DIAGNOSIS — M722 Plantar fascial fibromatosis: Secondary | ICD-10-CM | POA: Diagnosis not present

## 2019-12-23 DIAGNOSIS — M79672 Pain in left foot: Secondary | ICD-10-CM

## 2019-12-23 DIAGNOSIS — M79671 Pain in right foot: Secondary | ICD-10-CM | POA: Diagnosis not present

## 2019-12-23 DIAGNOSIS — M21612 Bunion of left foot: Secondary | ICD-10-CM

## 2019-12-23 DIAGNOSIS — M21611 Bunion of right foot: Secondary | ICD-10-CM | POA: Diagnosis not present

## 2019-12-27 ENCOUNTER — Encounter: Payer: Self-pay | Admitting: Podiatry

## 2019-12-27 NOTE — Progress Notes (Signed)
Subjective:  Patient ID: [name], female    DOB: 1960-10-08,  MRN: [id number]  Chief Complaint  Patient presents with  . Foot Pain    pt is here for bil foot pain    59 y.o. female presents with the above complaint.  Patient presents with bilateral bunion deformities with left worse than right side.  Patient has been going for multiple years is progressing on worse.  She states that it is painful to touch her pain is on and off however she has tried conservative options at with shoe gear modification protecting the bunion.  She would like to discuss surgical options.  She may think about surgery in the future.  She denies any other acute complaints.  Review of Systems: Negative except as noted in the HPI. Denies N/V/F/Ch.  Past Medical History:  Diagnosis Date  . Anemia    hx  . Anxiety    hx  . Arthritis    DJD LOWER BACK  . Bladder tumor   . Complication of anesthesia    BP DROPPED WITH LAST SURGERY 2019  . Depression    hx  . Eczema   . GERD (gastroesophageal reflux disease)    occ tums  . Heart murmur    "WENT AWAY" NO CARDIOLOGIST  . Hyperlipidemia   . Incontinence of urine   . Tubular adenoma of colon     Current Outpatient Medications:  .  HYDROcodone-acetaminophen (NORCO) 5-325 MG tablet, Take 1 tablet by mouth every 4 (four) hours as needed for moderate pain., Disp: 10 tablet, Rfl: 0 .  omeprazole (PRILOSEC) 20 MG capsule, Take 20 mg by mouth daily., Disp: , Rfl:   Social History   Tobacco Use  Smoking Status Never Smoker  Smokeless Tobacco Never Used    No Known Allergies Objective:  There were no vitals filed for this visit. There is no height or weight on file to calculate BMI. Constitutional Well developed. Well nourished.  Vascular Dorsalis pedis pulses palpable bilaterally. Posterior tibial pulses palpable bilaterally. Capillary refill normal to all digits.  No cyanosis or clubbing noted. Pedal hair growth normal.  Neurologic Normal  speech. Oriented to person, place, and time. Epicritic sensation to light touch grossly present bilaterally.  Dermatologic Nails well groomed and normal in appearance. No open wounds. No skin lesions.  Orthopedic: Normal joint ROM without pain or crepitus bilaterally. Hallux abductovalgus deformity present bilaterally with left greater than right.  It is a track bound not tracking deformity.  No intra-articular crepitus noted. Left 1st MPJ diminished range of motion. Left 1st TMT with gross hypermobility.  Laxity of the first tarsometatarsal joint clinically palpated Right 1st MPJ full range of motion  Right 1st TMT without gross hypermobility. Lesser digital contractures present bilaterally.   Radiographs: Taken and reviewed. Hallux abductovalgus deformity present. 3 views of skeletally mature adult foot bunion deformity noted with severe increase in the intermetatarsal angle on the left side and moderate increase in intermetatarsal on the right side.  Mild hallux valgus deformity noted.  Metatarsal parabola intact.  No other osseous abnormalities noted.  Tibial sesamoid position 6 out of 7 bilaterally   Assessment:   1. Foot pain, bilateral   2. Bunion, right   3. Bunion, left    Plan:  Patient was evaluated and treated and all questions answered.  Hallux abductovalgus deformity, Left greater than right  -XR as above. -I discussed with the patient in extensive detail about various treatment options are available for bunion deformity.  Given that patient has failed all conservative therapy at this time I believe should benefit from surgical intervention.  Radiographically her IM angle is worse on the left side than right side.  Given the severe nature I believe patient will benefit from Lapidus procedure on the left side.  I believe she will benefit from a have procedure on the right side there is more moderate in nature.  I discussed this with patient extensive detail.  Patient would  like to think about surgery I will see her back again as as needed until she is ready for surgery.  Patient states understanding. No follow-ups on file.

## 2020-05-09 ENCOUNTER — Other Ambulatory Visit: Payer: BLUE CROSS/BLUE SHIELD

## 2020-05-10 DIAGNOSIS — R0982 Postnasal drip: Secondary | ICD-10-CM | POA: Diagnosis not present

## 2020-05-14 DIAGNOSIS — Z1152 Encounter for screening for COVID-19: Secondary | ICD-10-CM | POA: Diagnosis not present

## 2020-05-15 DIAGNOSIS — R059 Cough, unspecified: Secondary | ICD-10-CM | POA: Diagnosis not present

## 2020-05-21 ENCOUNTER — Other Ambulatory Visit: Payer: Self-pay | Admitting: Family Medicine

## 2020-05-21 DIAGNOSIS — Z1231 Encounter for screening mammogram for malignant neoplasm of breast: Secondary | ICD-10-CM

## 2020-05-22 ENCOUNTER — Other Ambulatory Visit: Payer: BLUE CROSS/BLUE SHIELD

## 2020-06-21 DIAGNOSIS — D414 Neoplasm of uncertain behavior of bladder: Secondary | ICD-10-CM | POA: Diagnosis not present

## 2020-06-29 DIAGNOSIS — K219 Gastro-esophageal reflux disease without esophagitis: Secondary | ICD-10-CM | POA: Diagnosis not present

## 2020-06-29 DIAGNOSIS — M545 Low back pain, unspecified: Secondary | ICD-10-CM | POA: Diagnosis not present

## 2020-06-29 DIAGNOSIS — R030 Elevated blood-pressure reading, without diagnosis of hypertension: Secondary | ICD-10-CM | POA: Diagnosis not present

## 2020-08-22 DIAGNOSIS — M545 Low back pain, unspecified: Secondary | ICD-10-CM | POA: Diagnosis not present

## 2020-08-28 ENCOUNTER — Other Ambulatory Visit: Payer: Self-pay | Admitting: Sports Medicine

## 2020-08-28 DIAGNOSIS — M545 Low back pain, unspecified: Secondary | ICD-10-CM

## 2020-09-15 ENCOUNTER — Other Ambulatory Visit: Payer: BLUE CROSS/BLUE SHIELD

## 2020-10-05 DIAGNOSIS — M545 Low back pain, unspecified: Secondary | ICD-10-CM | POA: Diagnosis not present

## 2020-10-19 ENCOUNTER — Other Ambulatory Visit: Payer: Self-pay

## 2020-10-19 ENCOUNTER — Ambulatory Visit
Admission: RE | Admit: 2020-10-19 | Discharge: 2020-10-19 | Disposition: A | Discharge status: Home / Self Care | Payer: BLUE CROSS/BLUE SHIELD | Source: Ambulatory Visit | Attending: Family Medicine | Admitting: Family Medicine

## 2020-10-19 DIAGNOSIS — E2839 Other primary ovarian failure: Secondary | ICD-10-CM

## 2020-10-19 DIAGNOSIS — Z1231 Encounter for screening mammogram for malignant neoplasm of breast: Secondary | ICD-10-CM | POA: Diagnosis not present

## 2020-10-19 DIAGNOSIS — M8588 Other specified disorders of bone density and structure, other site: Secondary | ICD-10-CM | POA: Diagnosis not present

## 2020-10-19 DIAGNOSIS — Z780 Asymptomatic menopausal state: Secondary | ICD-10-CM | POA: Diagnosis not present

## 2020-11-02 DIAGNOSIS — Z01419 Encounter for gynecological examination (general) (routine) without abnormal findings: Secondary | ICD-10-CM | POA: Diagnosis not present

## 2020-12-07 DIAGNOSIS — F411 Generalized anxiety disorder: Secondary | ICD-10-CM | POA: Diagnosis not present

## 2020-12-14 DIAGNOSIS — F411 Generalized anxiety disorder: Secondary | ICD-10-CM | POA: Diagnosis not present

## 2020-12-14 DIAGNOSIS — J301 Allergic rhinitis due to pollen: Secondary | ICD-10-CM | POA: Diagnosis not present

## 2020-12-14 DIAGNOSIS — Z0000 Encounter for general adult medical examination without abnormal findings: Secondary | ICD-10-CM | POA: Diagnosis not present

## 2020-12-14 DIAGNOSIS — K219 Gastro-esophageal reflux disease without esophagitis: Secondary | ICD-10-CM | POA: Diagnosis not present

## 2021-01-11 DIAGNOSIS — Z13220 Encounter for screening for lipoid disorders: Secondary | ICD-10-CM | POA: Diagnosis not present

## 2021-01-11 DIAGNOSIS — Z0000 Encounter for general adult medical examination without abnormal findings: Secondary | ICD-10-CM | POA: Diagnosis not present

## 2021-06-28 ENCOUNTER — Ambulatory Visit: Payer: BLUE CROSS/BLUE SHIELD | Admitting: Podiatry

## 2021-07-16 DIAGNOSIS — H52223 Regular astigmatism, bilateral: Secondary | ICD-10-CM | POA: Diagnosis not present

## 2021-07-16 DIAGNOSIS — H0102B Squamous blepharitis left eye, upper and lower eyelids: Secondary | ICD-10-CM | POA: Diagnosis not present

## 2021-07-16 DIAGNOSIS — H2513 Age-related nuclear cataract, bilateral: Secondary | ICD-10-CM | POA: Diagnosis not present

## 2021-07-16 DIAGNOSIS — H0102A Squamous blepharitis right eye, upper and lower eyelids: Secondary | ICD-10-CM | POA: Diagnosis not present

## 2021-07-16 DIAGNOSIS — H5203 Hypermetropia, bilateral: Secondary | ICD-10-CM | POA: Diagnosis not present

## 2021-07-16 DIAGNOSIS — H524 Presbyopia: Secondary | ICD-10-CM | POA: Diagnosis not present

## 2021-07-16 DIAGNOSIS — H40013 Open angle with borderline findings, low risk, bilateral: Secondary | ICD-10-CM | POA: Diagnosis not present

## 2021-08-02 DIAGNOSIS — D414 Neoplasm of uncertain behavior of bladder: Secondary | ICD-10-CM | POA: Diagnosis not present

## 2021-08-02 DIAGNOSIS — N393 Stress incontinence (female) (male): Secondary | ICD-10-CM | POA: Diagnosis not present

## 2021-09-20 ENCOUNTER — Other Ambulatory Visit: Payer: Self-pay | Admitting: Family Medicine

## 2021-09-20 DIAGNOSIS — Z1231 Encounter for screening mammogram for malignant neoplasm of breast: Secondary | ICD-10-CM

## 2021-11-13 ENCOUNTER — Ambulatory Visit: Payer: BLUE CROSS/BLUE SHIELD | Admitting: Podiatry

## 2021-11-13 ENCOUNTER — Ambulatory Visit
Admission: RE | Admit: 2021-11-13 | Discharge: 2021-11-13 | Disposition: A | Discharge status: Home / Self Care | Payer: BLUE CROSS/BLUE SHIELD | Source: Ambulatory Visit | Attending: Family Medicine | Admitting: Family Medicine

## 2021-11-13 DIAGNOSIS — M79671 Pain in right foot: Secondary | ICD-10-CM | POA: Diagnosis not present

## 2021-11-13 DIAGNOSIS — M21612 Bunion of left foot: Secondary | ICD-10-CM

## 2021-11-13 DIAGNOSIS — M79672 Pain in left foot: Secondary | ICD-10-CM | POA: Diagnosis not present

## 2021-11-13 DIAGNOSIS — M21611 Bunion of right foot: Secondary | ICD-10-CM

## 2021-11-13 DIAGNOSIS — Z1231 Encounter for screening mammogram for malignant neoplasm of breast: Secondary | ICD-10-CM

## 2021-11-13 NOTE — Progress Notes (Signed)
Subjective:  Patient ID: [name], female    DOB: July 14, 1960,  MRN: [id number]  Chief Complaint  Patient presents with   surgery    Surgery consult - Bil foot pain    61 y.o. female presents with the above complaint.  Patient presents with bilateral bunion deformities with left worse than right side.  Patient has been going for multiple years is progressing on worse.  She states that it is painful to touch her pain is on and off however she has tried conservative options at with shoe gear modification protecting the bunion.  She would like to discuss surgical options.  She may think about surgery in the future.  She denies any other acute complaints.  Review of Systems: Negative except as noted in the HPI. Denies N/V/F/Ch.  Past Medical History:  Diagnosis Date   Anemia    hx   Anxiety    hx   Arthritis    DJD LOWER BACK   Bladder tumor    Complication of anesthesia    BP DROPPED WITH LAST SURGERY 2019   Depression    hx   Eczema    GERD (gastroesophageal reflux disease)    occ tums   Heart murmur    "WENT AWAY" NO CARDIOLOGIST   Hyperlipidemia    Incontinence of urine    Tubular adenoma of colon     Current Outpatient Medications:    HYDROcodone-acetaminophen (NORCO) 5-325 MG tablet, Take 1 tablet by mouth every 4 (four) hours as needed for moderate pain., Disp: 10 tablet, Rfl: 0   omeprazole (PRILOSEC) 20 MG capsule, Take 20 mg by mouth daily., Disp: , Rfl:   Social History   Tobacco Use  Smoking Status Never  Smokeless Tobacco Never    No Known Allergies Objective:  There were no vitals filed for this visit. There is no height or weight on file to calculate BMI. Constitutional Well developed. Well nourished.  Vascular Dorsalis pedis pulses palpable bilaterally. Posterior tibial pulses palpable bilaterally. Capillary refill normal to all digits.  No cyanosis or clubbing noted. Pedal hair growth normal.  Neurologic Normal speech. Oriented to person,  place, and time. Epicritic sensation to light touch grossly present bilaterally.  Dermatologic Nails well groomed and normal in appearance. No open wounds. No skin lesions.  Orthopedic: Normal joint ROM without pain or crepitus bilaterally. Hallux abductovalgus deformity present bilaterally with left greater than right.  It is a track bound not tracking deformity.  No intra-articular crepitus noted. Left 1st MPJ diminished range of motion. Left 1st TMT with gross hypermobility.  Laxity of the first tarsometatarsal joint clinically palpated Right 1st MPJ full range of motion  Right 1st TMT without gross hypermobility. Lesser digital contractures present bilaterally.   Radiographs: Taken and reviewed. Hallux abductovalgus deformity present. 3 views of skeletally mature adult foot bunion deformity noted with severe increase in the intermetatarsal angle on the left side and moderate increase in intermetatarsal on the right side.  Mild hallux valgus deformity noted.  Metatarsal parabola intact.  No other osseous abnormalities noted.  Tibial sesamoid position 6 out of 7 bilaterally   Assessment:   1. Foot pain, bilateral   2. Bunion, right   3. Bunion, left     Plan:  Patient was evaluated and treated and all questions answered.  Hallux abductovalgus deformity, Left greater than right  -XR as above. -I discussed with the patient in extensive detail about various treatment options are available for bunion deformity.  Given that  patient has failed all conservative therapy at this time I believe should benefit from surgical intervention.  Radiographically her IM angle is worse on the left side than right side.  Given the severe nature I believe patient will benefit from Lapidus procedure on the left side.  I believe she will benefit from a have procedure on the right side there is more moderate in nature.  I discussed this with patient extensive detail.  Patient would like to think about surgery  I will see her back again as as needed until she is ready for surgery.  Patient states understanding. No follow-ups on file.

## 2022-01-03 DIAGNOSIS — Z124 Encounter for screening for malignant neoplasm of cervix: Secondary | ICD-10-CM | POA: Diagnosis not present

## 2022-01-03 DIAGNOSIS — Z01419 Encounter for gynecological examination (general) (routine) without abnormal findings: Secondary | ICD-10-CM | POA: Diagnosis not present

## 2022-01-20 DIAGNOSIS — E669 Obesity, unspecified: Secondary | ICD-10-CM | POA: Diagnosis not present

## 2022-01-20 DIAGNOSIS — J069 Acute upper respiratory infection, unspecified: Secondary | ICD-10-CM | POA: Diagnosis not present

## 2022-01-20 DIAGNOSIS — R030 Elevated blood-pressure reading, without diagnosis of hypertension: Secondary | ICD-10-CM | POA: Diagnosis not present

## 2022-01-20 DIAGNOSIS — J302 Other seasonal allergic rhinitis: Secondary | ICD-10-CM | POA: Diagnosis not present

## 2022-03-14 DIAGNOSIS — I10 Essential (primary) hypertension: Secondary | ICD-10-CM | POA: Diagnosis not present

## 2022-03-14 DIAGNOSIS — R5383 Other fatigue: Secondary | ICD-10-CM | POA: Diagnosis not present

## 2022-04-02 DIAGNOSIS — F411 Generalized anxiety disorder: Secondary | ICD-10-CM | POA: Diagnosis not present

## 2022-04-02 DIAGNOSIS — I10 Essential (primary) hypertension: Secondary | ICD-10-CM | POA: Diagnosis not present

## 2022-04-02 DIAGNOSIS — N951 Menopausal and female climacteric states: Secondary | ICD-10-CM | POA: Diagnosis not present

## 2022-04-02 DIAGNOSIS — H9311 Tinnitus, right ear: Secondary | ICD-10-CM | POA: Diagnosis not present

## 2022-05-11 DIAGNOSIS — R030 Elevated blood-pressure reading, without diagnosis of hypertension: Secondary | ICD-10-CM | POA: Diagnosis not present

## 2022-05-11 DIAGNOSIS — R239 Unspecified skin changes: Secondary | ICD-10-CM | POA: Diagnosis not present

## 2022-05-11 DIAGNOSIS — Z0130 Encounter for examination of blood pressure without abnormal findings: Secondary | ICD-10-CM | POA: Diagnosis not present

## 2022-05-11 DIAGNOSIS — L301 Dyshidrosis [pompholyx]: Secondary | ICD-10-CM | POA: Diagnosis not present

## 2022-05-26 DIAGNOSIS — L111 Transient acantholytic dermatosis [Grover]: Secondary | ICD-10-CM | POA: Diagnosis not present

## 2022-05-26 DIAGNOSIS — D492 Neoplasm of unspecified behavior of bone, soft tissue, and skin: Secondary | ICD-10-CM | POA: Diagnosis not present

## 2022-05-26 DIAGNOSIS — L280 Lichen simplex chronicus: Secondary | ICD-10-CM | POA: Diagnosis not present

## 2022-05-26 DIAGNOSIS — L570 Actinic keratosis: Secondary | ICD-10-CM | POA: Diagnosis not present

## 2022-06-21 ENCOUNTER — Encounter (HOSPITAL_COMMUNITY): Payer: Self-pay

## 2022-06-21 ENCOUNTER — Emergency Department (HOSPITAL_COMMUNITY)
Admission: EM | Admit: 2022-06-21 | Discharge: 2022-06-21 | Disposition: A | Discharge status: Home / Self Care | Payer: BLUE CROSS/BLUE SHIELD | Attending: Emergency Medicine | Admitting: Emergency Medicine

## 2022-06-21 ENCOUNTER — Emergency Department (HOSPITAL_COMMUNITY): Payer: BLUE CROSS/BLUE SHIELD

## 2022-06-21 ENCOUNTER — Other Ambulatory Visit: Payer: Self-pay

## 2022-06-21 DIAGNOSIS — R0602 Shortness of breath: Secondary | ICD-10-CM | POA: Diagnosis not present

## 2022-06-21 DIAGNOSIS — R079 Chest pain, unspecified: Secondary | ICD-10-CM | POA: Diagnosis not present

## 2022-06-21 DIAGNOSIS — R0789 Other chest pain: Secondary | ICD-10-CM | POA: Diagnosis not present

## 2022-06-21 DIAGNOSIS — R000 Tachycardia, unspecified: Secondary | ICD-10-CM | POA: Insufficient documentation

## 2022-06-21 DIAGNOSIS — R002 Palpitations: Secondary | ICD-10-CM | POA: Diagnosis not present

## 2022-06-21 DIAGNOSIS — J9811 Atelectasis: Secondary | ICD-10-CM | POA: Diagnosis not present

## 2022-06-21 DIAGNOSIS — F419 Anxiety disorder, unspecified: Secondary | ICD-10-CM | POA: Insufficient documentation

## 2022-06-21 DIAGNOSIS — R064 Hyperventilation: Secondary | ICD-10-CM | POA: Diagnosis not present

## 2022-06-21 LAB — BASIC METABOLIC PANEL
Anion gap: 16 — ABNORMAL HIGH (ref 5–15)
BUN: 8 mg/dL (ref 6–20)
CO2: 17 mmol/L — ABNORMAL LOW (ref 22–32)
Calcium: 9.2 mg/dL (ref 8.9–10.3)
Chloride: 104 mmol/L (ref 98–111)
Creatinine, Ser: 0.86 mg/dL (ref 0.44–1.00)
GFR, Estimated: >60 mL/min (ref >60)
Glucose, Bld: 148 mg/dL — ABNORMAL HIGH (ref 70–99)
Potassium: 2.7 mmol/L — CL (ref 3.5–5.1)
Sodium: 137 mmol/L (ref 135–145)

## 2022-06-21 LAB — CBC
HCT: 43.1 % (ref 36.0–46.0)
Hemoglobin: 15.3 g/dL — ABNORMAL HIGH (ref 12.0–15.0)
MCH: 29.7 pg (ref 26.0–34.0)
MCHC: 35.5 g/dL (ref 30.0–36.0)
MCV: 83.7 fL (ref 80.0–100.0)
Platelets: 240 10*3/uL (ref 150–400)
RBC: 5.15 MIL/uL — ABNORMAL HIGH (ref 3.87–5.11)
RDW: 11.8 % (ref 11.5–15.5)
WBC: 8.1 10*3/uL (ref 4.0–10.5)
nRBC: 0 % (ref 0.0–0.2)

## 2022-06-21 LAB — I-STAT BETA HCG BLOOD, ED (MC, WL, AP ONLY): I-stat hCG, quantitative: <5 m[IU]/mL (ref <5)

## 2022-06-21 LAB — D-DIMER, QUANTITATIVE: D-Dimer, Quant: <0.27 ug/mL-FEU (ref 0.00–0.50)

## 2022-06-21 LAB — POTASSIUM: Potassium: 3.1 mmol/L — ABNORMAL LOW (ref 3.5–5.1)

## 2022-06-21 LAB — MAGNESIUM: Magnesium: 1.8 mg/dL (ref 1.7–2.4)

## 2022-06-21 LAB — TROPONIN I (HIGH SENSITIVITY)
Troponin I (High Sensitivity): 4 ng/L (ref <18)
Troponin I (High Sensitivity): 15 ng/L (ref <18)

## 2022-06-21 MED ORDER — POTASSIUM CHLORIDE 10 MEQ/100ML IV SOLN
10.0000 meq | INTRAVENOUS | Status: DC
  Administered 2022-06-21: 10 meq via INTRAVENOUS
  Filled 2022-06-21: qty 100

## 2022-06-21 MED ORDER — SODIUM CHLORIDE 0.9 % IV BOLUS
1000.0000 mL | Freq: Once | INTRAVENOUS | Status: AC
  Administered 2022-06-21: 1000 mL via INTRAVENOUS

## 2022-06-21 MED ORDER — LORAZEPAM 1 MG PO TABS
1.0000 mg | ORAL_TABLET | Freq: Once | ORAL | Status: AC
  Administered 2022-06-21: 1 mg via ORAL
  Filled 2022-06-21: qty 1

## 2022-06-21 MED ORDER — POTASSIUM CHLORIDE CRYS ER 20 MEQ PO TBCR
40.0000 meq | EXTENDED_RELEASE_TABLET | Freq: Once | ORAL | Status: DC
  Filled 2022-06-21: qty 2

## 2022-06-21 NOTE — ED Notes (Signed)
Discharge instructions reviewed with patient. Patient denies any questions or concerns. Patient ambulatory out to lobby with husband.

## 2022-06-21 NOTE — Discharge Instructions (Addendum)
Today your findings show that your heart is functioning okay however there was an increase in your heart enzyme, this is likely secondary to hypertension, however please follow-up with cardiology.  If you have worsening chest pain, shortness of breath please return to the ER.  Additionally you should follow-up with the behavioral health center as seen above.  They can help with your anxiety, and start her on medications.  Return to the ER if you any concerns, want to kill yourself, or kill other people.

## 2022-06-21 NOTE — ED Triage Notes (Signed)
Patient reports she is under a lot of stress and could feel her bp going up and her heart racing.  Patient arrived here hyperventilating, refusing to open her eyes.  Patients bilateral hands were cramping and tingling.  Advised patient she needs to slow her breathing and taking slow deep breaths.  No focal neuro symptoms noted.
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
## 2022-06-23 DIAGNOSIS — I10 Essential (primary) hypertension: Secondary | ICD-10-CM | POA: Diagnosis not present

## 2022-06-23 DIAGNOSIS — E876 Hypokalemia: Secondary | ICD-10-CM | POA: Diagnosis not present

## 2022-06-23 DIAGNOSIS — E559 Vitamin D deficiency, unspecified: Secondary | ICD-10-CM | POA: Diagnosis not present

## 2022-06-23 DIAGNOSIS — R079 Chest pain, unspecified: Secondary | ICD-10-CM | POA: Diagnosis not present

## 2022-06-26 ENCOUNTER — Encounter: Payer: Self-pay | Admitting: Family Medicine

## 2022-06-26 DIAGNOSIS — Z1231 Encounter for screening mammogram for malignant neoplasm of breast: Secondary | ICD-10-CM

## 2022-06-27 DIAGNOSIS — E785 Hyperlipidemia, unspecified: Secondary | ICD-10-CM | POA: Diagnosis not present

## 2022-06-27 DIAGNOSIS — F419 Anxiety disorder, unspecified: Secondary | ICD-10-CM | POA: Diagnosis not present

## 2022-06-27 DIAGNOSIS — Z1330 Encounter for screening examination for mental health and behavioral disorders, unspecified: Secondary | ICD-10-CM | POA: Diagnosis not present

## 2022-06-27 DIAGNOSIS — I10 Essential (primary) hypertension: Secondary | ICD-10-CM | POA: Diagnosis not present

## 2022-06-27 DIAGNOSIS — R079 Chest pain, unspecified: Secondary | ICD-10-CM | POA: Diagnosis not present

## 2022-07-11 DIAGNOSIS — I10 Essential (primary) hypertension: Secondary | ICD-10-CM | POA: Diagnosis not present

## 2022-07-11 DIAGNOSIS — F411 Generalized anxiety disorder: Secondary | ICD-10-CM | POA: Diagnosis not present

## 2022-07-17 DIAGNOSIS — L281 Prurigo nodularis: Secondary | ICD-10-CM | POA: Diagnosis not present

## 2022-07-17 DIAGNOSIS — L298 Other pruritus: Secondary | ICD-10-CM | POA: Diagnosis not present

## 2022-07-21 ENCOUNTER — Other Ambulatory Visit: Payer: Self-pay | Admitting: Family Medicine

## 2022-07-21 DIAGNOSIS — Z1231 Encounter for screening mammogram for malignant neoplasm of breast: Secondary | ICD-10-CM

## 2022-07-31 DIAGNOSIS — L281 Prurigo nodularis: Secondary | ICD-10-CM | POA: Diagnosis not present

## 2022-08-12 DIAGNOSIS — Z0000 Encounter for general adult medical examination without abnormal findings: Secondary | ICD-10-CM | POA: Diagnosis not present

## 2022-08-12 DIAGNOSIS — E7800 Pure hypercholesterolemia, unspecified: Secondary | ICD-10-CM | POA: Diagnosis not present

## 2022-08-12 DIAGNOSIS — Z23 Encounter for immunization: Secondary | ICD-10-CM | POA: Diagnosis not present

## 2022-08-21 DIAGNOSIS — I10 Essential (primary) hypertension: Secondary | ICD-10-CM | POA: Diagnosis not present

## 2022-08-21 DIAGNOSIS — F411 Generalized anxiety disorder: Secondary | ICD-10-CM | POA: Diagnosis not present

## 2022-08-21 DIAGNOSIS — R002 Palpitations: Secondary | ICD-10-CM | POA: Diagnosis not present

## 2022-08-25 DIAGNOSIS — C44722 Squamous cell carcinoma of skin of right lower limb, including hip: Secondary | ICD-10-CM | POA: Diagnosis not present

## 2022-08-25 DIAGNOSIS — L281 Prurigo nodularis: Secondary | ICD-10-CM | POA: Diagnosis not present

## 2022-08-25 DIAGNOSIS — D1801 Hemangioma of skin and subcutaneous tissue: Secondary | ICD-10-CM | POA: Diagnosis not present

## 2022-08-25 DIAGNOSIS — D492 Neoplasm of unspecified behavior of bone, soft tissue, and skin: Secondary | ICD-10-CM | POA: Diagnosis not present

## 2022-08-28 ENCOUNTER — Encounter: Payer: Self-pay | Admitting: Cardiology

## 2022-08-28 ENCOUNTER — Ambulatory Visit: Payer: BLUE CROSS/BLUE SHIELD | Admitting: Cardiology

## 2022-08-28 ENCOUNTER — Other Ambulatory Visit: Payer: BLUE CROSS/BLUE SHIELD

## 2022-08-28 VITALS — BP 123/80 | HR 75 | Resp 16 | Ht 64.0 in | Wt 162.0 lb

## 2022-08-28 DIAGNOSIS — R002 Palpitations: Secondary | ICD-10-CM

## 2022-08-28 DIAGNOSIS — I10 Essential (primary) hypertension: Secondary | ICD-10-CM | POA: Diagnosis not present

## 2022-08-28 NOTE — Progress Notes (Signed)
ID:  [name], DOB [birth year], MRN [id number]  PCP:  Wilfrid Lund, PA  Cardiologist:  Tessa Lerner, DO, [facility] (established care 08/28/22)  REASON FOR CONSULT: Palpitations  REQUESTING PHYSICIAN:  Wilfrid Lund, PA [address] [place],  [place] [address]  Chief Complaint  Patient presents with   Palpitations   New Patient (Initial Visit)    HPI  [name] is a 62 y.o. Caucasian female who presents to the clinic for evaluation of palpitations at the request of Wilfrid Lund, [place]. Her past medical history and cardiovascular risk factors include: Anxiety, Hypertension, hypercholesterolemia, vitamin D deficiency.  Patient is referred to the practice for evaluation of palpitations.  They have been ongoing for the last several months but are more regular/noticeable.  She notices these episodes and moments of extreme anxiety and stress.  She eventually hyperventilates her blood pressure elevates and as result she takes Xanax and her symptoms of palpitations/elevated blood pressures essentially resolved and/or improve.  She is never experienced near syncope or syncope  along with her palpitations.  She is cut down the consumption of coffee and sodas. And she does not consume energy drinks, illicits, weight loss supplements, stimulants, herbs, or new over-the-counter medications.  She has been started on anxiolytics and soon will also start Effexor.  She denies anginal chest pain or heart failure symptoms.  FUNCTIONAL STATUS: No structured exercise program or daily routine.   ALLERGIES: No Known Allergies  MEDICATION LIST PRIOR TO VISIT: Current Meds  Medication Sig   ALPRAZolam (NIRAVAM) 0.25 MG dissolvable tablet Take 0.25 mg by mouth at bedtime as needed for anxiety.   FLUoxetine (PROZAC) 20 MG capsule Take 20 mg by mouth daily.   olmesartan (BENICAR) 40 MG tablet Take 40 mg by mouth daily.   omeprazole (PRILOSEC) 20 MG capsule Take 20 mg by mouth daily.      PAST MEDICAL HISTORY: Past Medical History:  Diagnosis Date   Anemia    hx   Anxiety    hx   Arthritis    DJD LOWER BACK   Bladder tumor    Complication of anesthesia    BP DROPPED WITH LAST SURGERY 2019   Depression    hx   Eczema    GERD (gastroesophageal reflux disease)    occ tums   Heart murmur    "WENT AWAY" NO CARDIOLOGIST   Hyperlipidemia    Incontinence of urine    Tubular adenoma of colon     PAST SURGICAL HISTORY: Past Surgical History:  Procedure Laterality Date   BREAST BIOPSY Right 2017   BENIGN   THYROIDECTOMY Left 03/09/2015   Procedure: LEFT THYROID LOBECTOMY;  Surgeon: Christia Reading, MD;  Location: [facility] OR;  Service: ENT;  Laterality: Left;   TRANSURETHRAL RESECTION OF BLADDER TUMOR WITH MITOMYCIN-C N/A 06/13/2019   Procedure: TRANSURETHRAL RESECTION OF BLADDER TUMOR WITH GEMCITABINE;  Surgeon: Crista Elliot, MD;  Location: [facility] [place];  Service: Urology;  Laterality: N/A;   TUBAL LIGATION     UTERUS CLEANED OUT  04/2018   WISDOM TOOTH EXTRACTION      FAMILY HISTORY: The patient family history includes Breast cancer in her mother; Cancer in her maternal grandmother and mother; Colon cancer in her mother; Depression in her mother; Diabetes in her mother; Heart attack in her father; Heart disease in her father; Hypertension in her father; Ovarian cancer in her maternal grandmother; Stomach cancer in her paternal aunt.  SOCIAL HISTORY:  The patient  reports that she has never smoked. She has never used smokeless tobacco. She reports that she does not drink alcohol and does not use drugs.  REVIEW OF SYSTEMS: Review of Systems  HENT:  Positive for tinnitus.   Cardiovascular:  Positive for palpitations. Negative for chest pain, claudication, dyspnea on exertion, irregular heartbeat, leg swelling, near-syncope, orthopnea, paroxysmal nocturnal dyspnea and syncope.  Respiratory:  Negative for shortness of breath.   Hematologic/Lymphatic:  Negative for bleeding problem.  Musculoskeletal:  Negative for muscle cramps and myalgias.  Neurological:  Negative for dizziness and light-headedness.    PHYSICAL EXAM:    08/28/2022    1:41 PM 06/21/2022    6:15 PM 06/21/2022    4:30 PM  Vitals with BMI  Height 5\' 4"     Weight 162 lbs    BMI 27.79    Systolic 123 151 161  Diastolic 80 113 107  Pulse 75 82 91    Physical Exam  Constitutional: No distress.  Age appropriate, hemodynamically stable.   Neck: No JVD present.  Cardiovascular: Normal rate, regular rhythm, S1 normal, S2 normal, intact distal pulses and normal pulses. Exam reveals no gallop, no S3 and no S4.  No murmur heard. Pulmonary/Chest: Effort normal and breath sounds normal. No stridor. She has no wheezes. She has no rales.  Abdominal: Soft. Bowel sounds are normal. She exhibits no distension. There is no abdominal tenderness.  Musculoskeletal:        General: No edema.     Cervical back: Neck supple.  Neurological: She is alert and oriented to person, place, and time. She has intact cranial nerves (2-12).  Skin: Skin is warm and moist.   CARDIAC DATABASE: EKG: August 28, 2022: Sinus rhythm, 65 bpm, normal axis, without underlying ischemia or injury pattern.  Echocardiogram: No results found for this or any previous visit from the past 1095 days.    Stress Testing: No results found for this or any previous visit from the past 1095 days.   Heart Catheterization: None  LABORATORY DATA:    Latest Ref Rng & Units 06/21/2022    2:20 PM 03/02/2015    3:56 PM 11/24/2014    8:56 AM  CBC  WBC 4.0 - 10.5 K/uL 8.1  6.9  5.9   Hemoglobin 12.0 - 15.0 g/dL 09.6  04.5  40.9   Hematocrit 36.0 - 46.0 % 43.1  36.8  41.1   Platelets 150 - 400 K/uL 240  260  185.0        Latest Ref Rng & Units 06/21/2022    3:27 PM 06/21/2022    2:20 PM 03/02/2015    3:56 PM  CMP  Glucose 70 - 99 mg/dL  811  98   BUN 6 - 20 mg/dL  8  12   Creatinine 9.14 - 1.00 mg/dL  7.82   9.56   Sodium 213 - 145 mmol/L  137  139   Potassium 3.5 - 5.1 mmol/L 3.1  2.7  3.6   Chloride 98 - 111 mmol/L  104  105   CO2 22 - 32 mmol/L  17  23   Calcium 8.9 - 10.3 mg/dL  9.2  8.9     Lipid Panel     Component Value Date/Time   CHOL 151 11/24/2014 0856   TRIG 53.0 11/24/2014 0856   HDL 54.00 11/24/2014 0856   CHOLHDL 3 11/24/2014 0856   VLDL 10.6 11/24/2014 0856   LDLCALC 86 11/24/2014 0856   LDLDIRECT  79.4 07/16/2010 0920    No components found for: "NTPROBNP" No results for input(s): "PROBNP" in the last 8760 hours. No results for input(s): "TSH" in the last 8760 hours.  BMP Recent Labs    06/21/22 1420 06/21/22 1527  NA 137  --   K 2.7* 3.1*  CL 104  --   CO2 17*  --   GLUCOSE 148*  --   BUN 8  --   CREATININE 0.86  --   CALCIUM 9.2  --   GFRNONAA >60  --     HEMOGLOBIN A1C No results found for: "HGBA1C", "MPG"  External Labs: Collected: 08/13/2022 provided by referring physician. Hemoglobin 13.1, hematocrit 39.2%. Vitamin D 51.5. BUN 13, creatinine 0.78. Sodium 139, potassium 3.8, chloride 106, bicarb 27. AST 20, ALT 23, alkaline phosphatase 100 Total cholesterol 195, triglycerides 138, HDL 41, LDL 129, non-HDL 154   IMPRESSION:    ICD-10-CM   1. Palpitations  R00.2 EKG 12-Lead    LONG TERM MONITOR (3-14 DAYS)    CANCELED: LONG TERM MONITOR (3-14 DAYS)    2. Benign hypertension  I10 PCV ECHOCARDIOGRAM COMPLETE       RECOMMENDATIONS: Modene Andy is a 62 y.o. Caucasian female whose past medical history and cardiac risk factors include: Anxiety, Hypertension, hypercholesterolemia, vitamin D deficiency.  Palpitations Likely secondary to her episodes of anxiety. No identifiable reversible cause EKG shows sinus rhythm without ectopy Plan 7-day extended Holter monitor to evaluate for dysrhythmias Outside labs independently reviewed and noted above Patient is advised to check TSH with her PCP as well.  Benign hypertension Recently was  diagnosed with benign essential hypertension. Currently on olmesartan. I have asked her to check her blood pressures when she is calm/collected and around the same time.  She is asked to keep a track of these numbers and if the numbers are uncontrolled further up titration of medical therapy is appropriate.  However, isolated blood pressure readings when she is already anxious may not warrant uptitration antihypertensive medications as she would be prone to hypertensive symptoms at rest / not anxious.   Data Reviewed: I have independently reviewed external notes provided by the referring provider as part of this office visit.   I have independently reviewed results of labs, EKG as part of medical decision making. I have ordered the following tests:  Orders Placed This Encounter  Procedures   LONG TERM MONITOR (3-14 DAYS)    Standing Status:   Future    Number of Occurrences:   1    Order Specific Question:   Where should this test be performed?    Answer:   [facility]    Order Specific Question:   Does the patient have an implanted cardiac device?    Answer:   No    Order Specific Question:   Prescribed days of wear    Answer:   7    Order Specific Question:   Type of enrollment    Answer:   Clinic Enrollment   EKG 12-Lead   PCV ECHOCARDIOGRAM COMPLETE    Standing Status:   Future    Standing Expiration Date:   08/28/2023   I have not made medications changes at today's encounter as noted above.  FINAL MEDICATION LIST END OF ENCOUNTER: No orders of the defined types were placed in this encounter.   Medications Discontinued During This Encounter  Medication Reason   HYDROcodone-acetaminophen (NORCO) 5-325 MG tablet      Current Outpatient Medications:    ALPRAZolam (  NIRAVAM) 0.25 MG dissolvable tablet, Take 0.25 mg by mouth at bedtime as needed for anxiety., Disp: , Rfl:    FLUoxetine (PROZAC) 20 MG capsule, Take 20 mg by mouth daily., Disp: , Rfl:    olmesartan (BENICAR)  40 MG tablet, Take 40 mg by mouth daily., Disp: , Rfl:    omeprazole (PRILOSEC) 20 MG capsule, Take 20 mg by mouth daily., Disp: , Rfl:    venlafaxine XR (EFFEXOR-XR) 75 MG 24 hr capsule, Take 75 mg by mouth daily with breakfast. (Patient not taking: Reported on 08/28/2022), Disp: , Rfl:   Orders Placed This Encounter  Procedures   LONG TERM MONITOR (3-14 DAYS)   EKG 12-Lead   PCV ECHOCARDIOGRAM COMPLETE    There are no Patient Instructions on file for this visit.   --Continue cardiac medications as reconciled in final medication list. --Return in about 5 weeks (around 10/02/2022) for Follow up, Palpitations. or sooner if needed. --Continue follow-up with your primary care physician regarding the management of your other chronic comorbid conditions.  Patient's questions and concerns were addressed to her satisfaction. She voices understanding of the instructions provided during this encounter.   This note was created using a voice recognition software as a result there may be grammatical errors inadvertently enclosed that do not reflect the nature of this encounter. Every attempt is made to correct such errors.  Tessa Lerner, [place], [facility]  Pager:  [phone] Office: [phone]

## 2022-09-19 DIAGNOSIS — F411 Generalized anxiety disorder: Secondary | ICD-10-CM | POA: Diagnosis not present

## 2022-09-19 DIAGNOSIS — I10 Essential (primary) hypertension: Secondary | ICD-10-CM | POA: Diagnosis not present

## 2022-09-19 DIAGNOSIS — R002 Palpitations: Secondary | ICD-10-CM | POA: Diagnosis not present

## 2022-09-26 ENCOUNTER — Ambulatory Visit: Payer: BLUE CROSS/BLUE SHIELD

## 2022-09-26 DIAGNOSIS — I10 Essential (primary) hypertension: Secondary | ICD-10-CM

## 2022-10-03 DIAGNOSIS — R002 Palpitations: Secondary | ICD-10-CM | POA: Diagnosis not present

## 2022-10-12 DIAGNOSIS — R002 Palpitations: Secondary | ICD-10-CM | POA: Diagnosis not present

## 2022-10-14 ENCOUNTER — Ambulatory Visit: Payer: BLUE CROSS/BLUE SHIELD | Admitting: Cardiology

## 2022-10-14 ENCOUNTER — Encounter: Payer: Self-pay | Admitting: Cardiology

## 2022-10-14 VITALS — BP 111/72 | HR 72 | Ht 64.5 in | Wt 168.0 lb

## 2022-10-14 DIAGNOSIS — R072 Precordial pain: Secondary | ICD-10-CM

## 2022-10-14 DIAGNOSIS — R002 Palpitations: Secondary | ICD-10-CM | POA: Diagnosis not present

## 2022-10-14 DIAGNOSIS — I10 Essential (primary) hypertension: Secondary | ICD-10-CM

## 2022-10-14 NOTE — Progress Notes (Signed)
ID:  Diamond Buys, DOB 1961/03/17, MRN [id number]  PCP:  Wilfrid Lund, PA  Cardiologist:  Tessa Lerner, DO, [facility] (established care 08/28/22)  Date: 10/14/22 Last Office Visit: 08/28/2022  Chief Complaint  Patient presents with   Palpitations   Follow-up   Chest Pain    HPI  [name] is a 62 y.o. Caucasian female whose past medical history and cardiovascular risk factors include: Anxiety, Hypertension, hypercholesterolemia, vitamin D deficiency.  Patient was referred to practice for evaluation of palpitations.  Initially thought it was secondary to anxiety/stress.  The symptoms did improve with initiation of Xanax but due to age and risk factors she was referred to cardiology for further evaluation and management.  Since last office visit she did have a Zio patch which she notes underlying rhythm to be sinus no significant dysrhythmias a few rare/brief episodes of PSVT.  Clinically her palpitations have improved significantly since last office visit.  However, she has been experiencing precordial discomfort which she would like addressed.  The symptoms have been ongoing for more than a year, left-sided, sporadic, lasting for few minutes, not brought on by effort related activities, does not resolve with rest, self-limited.  She has had at least 2 episodes since mid May.  She denies any active chest pain.  Overall functional capacity remains stable.  FUNCTIONAL STATUS: No structured exercise program or daily routine.   ALLERGIES: No Known Allergies  MEDICATION LIST PRIOR TO VISIT: Current Meds  Medication Sig   ALPRAZolam (NIRAVAM) 0.25 MG dissolvable tablet Take 0.25 mg by mouth at bedtime as needed for anxiety.   Cyanocobalamin (B-12) 5000 MCG CAPS Take 1 capsule by mouth daily at 2 PM.   escitalopram (LEXAPRO) 10 MG tablet Take 10 mg by mouth daily.   Multiple Vitamin (MULTIVITAMIN) tablet Take 1 tablet by mouth daily.   olmesartan (BENICAR) 40 MG tablet Take 40 mg by  mouth daily.   omeprazole (PRILOSEC) 20 MG capsule Take 20 mg by mouth daily.   [DISCONTINUED] venlafaxine XR (EFFEXOR-XR) 75 MG 24 hr capsule Take 75 mg by mouth daily with breakfast.     PAST MEDICAL HISTORY: Past Medical History:  Diagnosis Date   Anemia    hx   Anxiety    hx   Arthritis    DJD LOWER BACK   Bladder tumor    Complication of anesthesia    BP DROPPED WITH LAST SURGERY 2019   Depression    hx   Eczema    GERD (gastroesophageal reflux disease)    occ tums   Heart murmur    "WENT AWAY" NO CARDIOLOGIST   Hyperlipidemia    Incontinence of urine    Tubular adenoma of colon     PAST SURGICAL HISTORY: Past Surgical History:  Procedure Laterality Date   BREAST BIOPSY Right 2017   BENIGN   THYROIDECTOMY Left 03/09/2015   Procedure: LEFT THYROID LOBECTOMY;  Surgeon: Christia Reading, MD;  Location: [facility] OR;  Service: ENT;  Laterality: Left;   TRANSURETHRAL RESECTION OF BLADDER TUMOR WITH MITOMYCIN-C N/A 06/13/2019   Procedure: TRANSURETHRAL RESECTION OF BLADDER TUMOR WITH GEMCITABINE;  Surgeon: Crista Elliot, MD;  Location: [facility] [place];  Service: Urology;  Laterality: N/A;   TUBAL LIGATION     UTERUS CLEANED OUT  04/2018   WISDOM TOOTH EXTRACTION      FAMILY HISTORY: The patient family history includes Breast cancer in her mother; Cancer in her maternal grandmother and mother; Colon cancer in her  mother; Depression in her mother; Diabetes in her mother; Heart attack in her father; Heart disease in her father; Hypertension in her father; Ovarian cancer in her maternal grandmother; Stomach cancer in her paternal aunt.  SOCIAL HISTORY:  The patient  reports that she has never smoked. She has never used smokeless tobacco. She reports that she does not drink alcohol and does not use drugs.  REVIEW OF SYSTEMS: Review of Systems  HENT:  Positive for tinnitus.   Cardiovascular:  Positive for chest pain. Negative for claudication, dyspnea on exertion,  irregular heartbeat, leg swelling, near-syncope, orthopnea, palpitations, paroxysmal nocturnal dyspnea and syncope.  Respiratory:  Negative for shortness of breath.   Hematologic/Lymphatic: Negative for bleeding problem.  Musculoskeletal:  Negative for muscle cramps and myalgias.  Neurological:  Negative for dizziness and light-headedness.    PHYSICAL EXAM:    10/14/2022    3:51 PM 08/28/2022    1:41 PM 06/21/2022    6:15 PM  Vitals with BMI  Height 5' 4.5" 5\' 4"    Weight 168 lbs 162 lbs   BMI 28.4 27.79   Systolic 111 123 161  Diastolic 72 80 113  Pulse 72 75 82    Physical Exam  Constitutional: No distress.  Age appropriate, hemodynamically stable.   Neck: No JVD present.  Cardiovascular: Normal rate, regular rhythm, S1 normal, S2 normal, intact distal pulses and normal pulses. Exam reveals no gallop, no S3 and no S4.  No murmur heard. Pulmonary/Chest: Effort normal and breath sounds normal. No stridor. She has no wheezes. She has no rales.  Abdominal: Soft. Bowel sounds are normal. She exhibits no distension. There is no abdominal tenderness.  Musculoskeletal:        General: No edema.     Cervical back: Neck supple.  Neurological: She is alert and oriented to person, place, and time. She has intact cranial nerves (2-12).  Skin: Skin is warm and moist.   CARDIAC DATABASE: EKG: August 28, 2022: Sinus rhythm, 65 bpm, normal axis, without underlying ischemia or injury pattern.  Echocardiogram: 09/26/2022: Left ventricle cavity is normal in size and wall thickness. Normal global wall motion. Normal LV systolic function with EF 67%. Normal diastolic filling pattern. Mild (Grade I) mitral regurgitation. Normal right atrial pressure.    Stress Testing: No results found for this or any previous visit from the past 1095 days.   Heart Catheterization: None  Cardiac monitor [facility] Patch): August 28, 2022 -September 07, 2022 Dominant rhythm sinus. Heart rate 47-152. Bpm. Avg HR 76  bpm. No atrial fibrillation, ventricular tachycardia, high grade AV block, pauses (3 seconds or longer). Total ventricular ectopic burden <1%. Total supraventricular ectopic burden <1%. Rare brief episodes of PSVT. Patient triggered events: 2. These did not correlate with any significant dysrhythmias.   LABORATORY DATA:    Latest Ref Rng & Units 06/21/2022    2:20 PM 03/02/2015    3:56 PM 11/24/2014    8:56 AM  CBC  WBC 4.0 - 10.5 K/uL 8.1  6.9  5.9   Hemoglobin 12.0 - 15.0 g/dL 09.6  04.5  40.9   Hematocrit 36.0 - 46.0 % 43.1  36.8  41.1   Platelets 150 - 400 K/uL 240  260  185.0        Latest Ref Rng & Units 06/21/2022    3:27 PM 06/21/2022    2:20 PM 03/02/2015    3:56 PM  CMP  Glucose 70 - 99 mg/dL  811  98   BUN 6 -  20 mg/dL  8  12   Creatinine 3.01 - 1.00 mg/dL  6.01  0.93   Sodium 235 - 145 mmol/L  137  139   Potassium 3.5 - 5.1 mmol/L 3.1  2.7  3.6   Chloride 98 - 111 mmol/L  104  105   CO2 22 - 32 mmol/L  17  23   Calcium 8.9 - 10.3 mg/dL  9.2  8.9     Lipid Panel     Component Value Date/Time   CHOL 151 11/24/2014 0856   TRIG 53.0 11/24/2014 0856   HDL 54.00 11/24/2014 0856   CHOLHDL 3 11/24/2014 0856   VLDL 10.6 11/24/2014 0856   LDLCALC 86 11/24/2014 0856   LDLDIRECT 79.4 07/16/2010 0920    No components found for: "NTPROBNP" No results for input(s): "PROBNP" in the last 8760 hours. No results for input(s): "TSH" in the last 8760 hours.  BMP Recent Labs    06/21/22 1420 06/21/22 1527  NA 137  --   K 2.7* 3.1*  CL 104  --   CO2 17*  --   GLUCOSE 148*  --   BUN 8  --   CREATININE 0.86  --   CALCIUM 9.2  --   GFRNONAA >60  --     HEMOGLOBIN A1C No results found for: "HGBA1C", "MPG"  External Labs: Collected: 08/13/2022 provided by referring physician. Hemoglobin 13.1, hematocrit 39.2%. Vitamin D 51.5. BUN 13, creatinine 0.78. Sodium 139, potassium 3.8, chloride 106, bicarb 27. AST 20, ALT 23, alkaline phosphatase 100 Total cholesterol  195, triglycerides 138, HDL 41, LDL 129, non-HDL 154   IMPRESSION:    ICD-10-CM   1. Precordial pain  R07.2 PCV CARDIAC STRESS TEST    CT CARDIAC SCORING (SELF PAY ONLY)    2. Palpitations  R00.2     3. Benign hypertension  I10        RECOMMENDATIONS: Amiel Speers is a 63 y.o. Caucasian female whose past medical history and cardiac risk factors include: Anxiety, Hypertension, hypercholesterolemia, vitamin D deficiency.  Precordial pain Predominately noncardiac. Shared decision was to proceed with exercise treadmill stress test for further evaluation. Coronary calcium score to evaluate for CAC and determine if pharmacological therapy for her lipid management is warranted at this time versus conservative management.  Palpitations Significantly improved since last office visit. Zio patch results reviewed with the patient independently including rhythm strips at today's office visit. No additional workup warranted.  Benign hypertension Blood pressures have improved on current medical therapy. Currently being managed by PCP. Home blood pressure log reviewed. No changes warranted at this time. Reemphasized importance of a low-salt diet.  FINAL MEDICATION LIST END OF ENCOUNTER: No orders of the defined types were placed in this encounter.   Medications Discontinued During This Encounter  Medication Reason   FLUoxetine (PROZAC) 20 MG capsule    venlafaxine XR (EFFEXOR-XR) 75 MG 24 hr capsule Change in therapy     Current Outpatient Medications:    ALPRAZolam (NIRAVAM) 0.25 MG dissolvable tablet, Take 0.25 mg by mouth at bedtime as needed for anxiety., Disp: , Rfl:    Cyanocobalamin (B-12) 5000 MCG CAPS, Take 1 capsule by mouth daily at 2 PM., Disp: , Rfl:    escitalopram (LEXAPRO) 10 MG tablet, Take 10 mg by mouth daily., Disp: , Rfl:    Multiple Vitamin (MULTIVITAMIN) tablet, Take 1 tablet by mouth daily., Disp: , Rfl:    olmesartan (BENICAR) 40 MG tablet, Take 40 mg by  mouth daily.,  Disp: , Rfl:    omeprazole (PRILOSEC) 20 MG capsule, Take 20 mg by mouth daily., Disp: , Rfl:   Orders Placed This Encounter  Procedures   CT CARDIAC SCORING (SELF PAY ONLY)   PCV CARDIAC STRESS TEST    There are no Patient Instructions on file for this visit.   --Continue cardiac medications as reconciled in final medication list. --Return in about 4 weeks (around 11/11/2022) for Follow up, Chest pain, Review test results. or sooner if needed. --Continue follow-up with your primary care physician regarding the management of your other chronic comorbid conditions.  Patient's questions and concerns were addressed to her satisfaction. She voices understanding of the instructions provided during this encounter.   This note was created using a voice recognition software as a result there may be grammatical errors inadvertently enclosed that do not reflect the nature of this encounter. Every attempt is made to correct such errors.  Tessa Lerner, [place], [facility]  Pager:  [phone] Office: [phone]

## 2022-10-28 DIAGNOSIS — D0471 Carcinoma in situ of skin of right lower limb, including hip: Secondary | ICD-10-CM | POA: Diagnosis not present

## 2022-11-03 DIAGNOSIS — D492 Neoplasm of unspecified behavior of bone, soft tissue, and skin: Secondary | ICD-10-CM | POA: Diagnosis not present

## 2022-11-03 DIAGNOSIS — L821 Other seborrheic keratosis: Secondary | ICD-10-CM | POA: Diagnosis not present

## 2022-11-03 DIAGNOSIS — L570 Actinic keratosis: Secondary | ICD-10-CM | POA: Diagnosis not present

## 2022-11-03 DIAGNOSIS — D225 Melanocytic nevi of trunk: Secondary | ICD-10-CM | POA: Diagnosis not present

## 2022-11-03 DIAGNOSIS — L814 Other melanin hyperpigmentation: Secondary | ICD-10-CM | POA: Diagnosis not present

## 2022-11-10 DIAGNOSIS — Z09 Encounter for follow-up examination after completed treatment for conditions other than malignant neoplasm: Secondary | ICD-10-CM | POA: Diagnosis not present

## 2022-11-10 DIAGNOSIS — Z86010 Personal history of colonic polyps: Secondary | ICD-10-CM | POA: Diagnosis not present

## 2022-11-10 DIAGNOSIS — K5730 Diverticulosis of large intestine without perforation or abscess without bleeding: Secondary | ICD-10-CM | POA: Diagnosis not present

## 2022-11-21 ENCOUNTER — Ambulatory Visit: Admission: RE | Admit: 2022-11-21 | Payer: BLUE CROSS/BLUE SHIELD | Source: Ambulatory Visit

## 2022-11-21 DIAGNOSIS — Z1231 Encounter for screening mammogram for malignant neoplasm of breast: Secondary | ICD-10-CM

## 2022-11-25 ENCOUNTER — Ambulatory Visit (HOSPITAL_COMMUNITY)
Admission: RE | Admit: 2022-11-25 | Discharge: 2022-11-25 | Disposition: A | Discharge status: Home / Self Care | Payer: Self-pay | Source: Ambulatory Visit | Attending: Cardiology | Admitting: Cardiology

## 2022-11-25 DIAGNOSIS — R072 Precordial pain: Secondary | ICD-10-CM | POA: Insufficient documentation

## 2022-11-28 DIAGNOSIS — I10 Essential (primary) hypertension: Secondary | ICD-10-CM | POA: Diagnosis not present

## 2022-11-28 DIAGNOSIS — F411 Generalized anxiety disorder: Secondary | ICD-10-CM | POA: Diagnosis not present

## 2022-11-28 DIAGNOSIS — R002 Palpitations: Secondary | ICD-10-CM | POA: Diagnosis not present

## 2022-12-02 ENCOUNTER — Ambulatory Visit: Payer: BLUE CROSS/BLUE SHIELD | Admitting: Cardiology

## 2022-12-05 NOTE — Progress Notes (Signed)
Called patient no answer left a vm

## 2022-12-09 NOTE — Progress Notes (Signed)
Spoke to patient, patient is aware of results

## 2022-12-22 DIAGNOSIS — S6991XA Unspecified injury of right wrist, hand and finger(s), initial encounter: Secondary | ICD-10-CM | POA: Diagnosis not present

## 2022-12-22 DIAGNOSIS — S62114A Nondisplaced fracture of triquetrum [cuneiform] bone, right wrist, initial encounter for closed fracture: Secondary | ICD-10-CM | POA: Diagnosis not present

## 2022-12-22 DIAGNOSIS — S99922A Unspecified injury of left foot, initial encounter: Secondary | ICD-10-CM | POA: Diagnosis not present

## 2022-12-22 DIAGNOSIS — M19031 Primary osteoarthritis, right wrist: Secondary | ICD-10-CM | POA: Diagnosis not present

## 2022-12-23 DIAGNOSIS — M25531 Pain in right wrist: Secondary | ICD-10-CM | POA: Diagnosis not present

## 2022-12-26 DIAGNOSIS — M25531 Pain in right wrist: Secondary | ICD-10-CM | POA: Diagnosis not present

## 2023-01-22 ENCOUNTER — Ambulatory Visit: Payer: BLUE CROSS/BLUE SHIELD

## 2023-01-22 DIAGNOSIS — R072 Precordial pain: Secondary | ICD-10-CM

## 2023-02-03 ENCOUNTER — Telehealth: Payer: Self-pay | Admitting: Cardiology

## 2023-02-03 DIAGNOSIS — D414 Neoplasm of uncertain behavior of bladder: Secondary | ICD-10-CM | POA: Diagnosis not present

## 2023-02-03 DIAGNOSIS — N393 Stress incontinence (female) (male): Secondary | ICD-10-CM | POA: Diagnosis not present

## 2023-02-03 NOTE — Telephone Encounter (Signed)
Returned pts call. Pt would like results of stress test and a follow up appointment if needed for the results. Did not see test resulted. Told pt someone will reach out to her once we had answers. Forwarding for advice.

## 2023-02-03 NOTE — Telephone Encounter (Signed)
Patient is calling for her results to her stress test she had a couple of weeks ago, she want to know if she needs to follow up.

## 2023-02-05 ENCOUNTER — Other Ambulatory Visit: Payer: Self-pay | Admitting: Urology

## 2023-02-09 NOTE — Telephone Encounter (Signed)
See result note for stress test.   Tessa Lerner, DO, [facility]

## 2023-02-09 NOTE — Progress Notes (Signed)
Spoke to the patient over the phone.  Reviewed the results of the GXT as well as coronary calcium score.  No recurrence of chest pain since last office visit.  Estimated 10-year risk of ASCVD is approximately 4%, total coronary calcium score is 3.7.  Reemphasized the importance of improving her modifiable cardiovascular risk factors with regards to diet and increasing physical activity as tolerated with a goal of 30 minutes a day 5 days a week.  Recommend 2-month follow-up visit to reevaluate chest pain and palpitations.  Sooner if needed.  Staff: Schedule a 63-month follow-up visit with myself or APP.  Rylen Hou [place], DO, [facility]

## 2023-02-18 NOTE — Patient Instructions (Addendum)
SURGICAL WAITING ROOM VISITATION  Patients having surgery or a procedure may have no more than 2 support people in the waiting area - these visitors may rotate.    Children under the age of 38 must have an adult with them who is not the patient.  Due to an increase in RSV and influenza rates and associated hospitalizations, children ages 18 and under may not visit patients in [facility] hospitals.  If the patient needs to stay at the hospital during part of their recovery, the visitor guidelines for inpatient rooms apply. Pre-op nurse will coordinate an appropriate time for 1 support person to accompany patient in pre-op.  This support person may not rotate.    Please refer to the [facility] website for the visitor guidelines for Inpatients (after your surgery is over and you are in a regular room).       Your procedure is scheduled on:  03/06/23    Report to [facility] Main Entrance    Report to admitting at  1200 noon    Call this number if you have problems the morning of surgery [phone]   Do not eat food  or drink liquids:After Midnight.               If you have questions, please contact your surgeon's office.      Oral Hygiene is also important to reduce your risk of infection.                                    Remember - BRUSH YOUR TEETH THE MORNING OF SURGERY WITH YOUR REGULAR TOOTHPASTE  DENTURES WILL BE REMOVED PRIOR TO SURGERY PLEASE DO NOT APPLY "Poly grip" OR ADHESIVES!!!   Do NOT smoke after Midnight   Stop all vitamins and herbal supplements 7 days before surgery.   Take these medicines the morning of surgery with A SIP OF WATER:  lexapro,  flonase if needed, omeprazole   DO NOT TAKE ANY ORAL DIABETIC MEDICATIONS DAY OF YOUR SURGERY  Bring CPAP mask and tubing day of surgery.                              You may not have any metal on your body including hair pins, jewelry, and body piercing             Do not wear make-up, lotions,  powders, perfumes/cologne, or deodorant  Do not wear nail polish including gel and S&S, artificial/acrylic nails, or any other type of covering on natural nails including finger and toenails. If you have artificial nails, gel coating, etc. that needs to be removed by a nail salon please have this removed prior to surgery or surgery may need to be canceled/ delayed if the surgeon/ anesthesia feels like they are unable to be safely monitored.   Do not shave  48 hours prior to surgery.               Men may shave face and neck.   Do not bring valuables to the hospital. [facility] IS NOT             RESPONSIBLE   FOR VALUABLES.   Contacts, glasses, dentures or bridgework may not be worn into surgery.   Bring small overnight bag day of surgery.   DO NOT BRING YOUR HOME MEDICATIONS TO THE  HOSPITAL. PHARMACY WILL DISPENSE MEDICATIONS LISTED ON YOUR MEDICATION LIST TO YOU DURING YOUR ADMISSION IN THE HOSPITAL!    Patients discharged on the day of surgery will not be allowed to drive home.  Someone NEEDS to stay with you for the first 24 hours after anesthesia.   Special Instructions: Bring a copy of your healthcare power of attorney and living will documents the day of surgery if you haven't scanned them before.              Please read over the following fact sheets you were given: IF YOU HAVE QUESTIONS ABOUT YOUR PRE-OP INSTRUCTIONS PLEASE CALL [phone]   If you received a COVID test during your pre-op visit  it is requested that you wear a mask when out in public, stay away from anyone that may not be feeling well and notify your surgeon if you develop symptoms. If you test positive for Covid or have been in contact with anyone that has tested positive in the last 10 days please notify you surgeon.    [facility] - Preparing for Surgery Before surgery, you can play an important role.  Because skin is not sterile, your skin needs to be as free of germs as possible.  You can reduce the  number of germs on your skin by washing with CHG (chlorahexidine gluconate) soap before surgery.  CHG is an antiseptic cleaner which kills germs and bonds with the skin to continue killing germs even after washing. Please DO NOT use if you have an allergy to CHG or antibacterial soaps.  If your skin becomes reddened/irritated stop using the CHG and inform your nurse when you arrive at Short Stay. Do not shave (including legs and underarms) for at least 48 hours prior to the first CHG shower.  You may shave your face/neck. Please follow these instructions carefully:  1.  Shower with CHG Soap the night before surgery and the  morning of Surgery.  2.  If you choose to wash your hair, wash your hair first as usual with your  normal  shampoo.  3.  After you shampoo, rinse your hair and body thoroughly to remove the  shampoo.                           4.  Use CHG as you would any other liquid soap.  You can apply chg directly  to the skin and wash                       Gently with a scrungie or clean washcloth.  5.  Apply the CHG Soap to your body ONLY FROM THE NECK DOWN.   Do not use on face/ open                           Wound or open sores. Avoid contact with eyes, ears mouth and genitals (private parts).                       Wash face,  Genitals (private parts) with your normal soap.             6.  Wash thoroughly, paying special attention to the area where your surgery  will be performed.  7.  Thoroughly rinse your body with warm water from the neck down.  8.  DO NOT shower/wash with your normal soap  after using and rinsing off  the CHG Soap.                9.  Pat yourself dry with a clean towel.            10.  Wear clean pajamas.            11.  Place clean sheets on your bed the night of your first shower and do not  sleep with pets. Day of Surgery : Do not apply any lotions/deodorants the morning of surgery.  Please wear clean clothes to the hospital/surgery center.  FAILURE TO FOLLOW  THESE INSTRUCTIONS MAY RESULT IN THE CANCELLATION OF YOUR SURGERY PATIENT SIGNATURE_________________________________  NURSE SIGNATURE__________________________________  ________________________________________________________________________

## 2023-02-18 NOTE — Progress Notes (Addendum)
Anesthesia Review:  PCP: Horton Marshall LOV 11/28/22  Cardiologist : Odis Hollingshead LOV 10/14/22  Chest x-ray : 06/21/22- 2 view  EKG :08/28/22  Monitor-10/12/22  Echo : 09/28/2022  Stress test: 02/04/23  CT Card- 12/03/22  Cardiac Cath :  Activity level: can do a flgiht of stiars wtihout difficutly  Sleep Study/ CPAP : none  Fasting Blood Sugar :      / Checks Blood Sugar -- times a day:   Blood Thinner/ Instructions /Last Dose: ASA / Instructions/ Last Dose :     Telephone appt on 02/23/23 at 1500.  Med hx and preop instructions completed.  Instructed pt to call ADmitting at [phone] as soon as preop phone call completed to cover check in and insurance info.  PT voiced understanding.    PT reports hs been having some tinnitus and " swimmy spells " since  OV  for anxiety in 11/2022. PT states at preop phone call she is going to inform PCP she is having surgery.

## 2023-02-20 DIAGNOSIS — Z01419 Encounter for gynecological examination (general) (routine) without abnormal findings: Secondary | ICD-10-CM | POA: Diagnosis not present

## 2023-02-23 ENCOUNTER — Encounter (HOSPITAL_COMMUNITY)
Admission: RE | Admit: 2023-02-23 | Discharge: 2023-02-23 | Disposition: A | Discharge status: Home / Self Care | Payer: BLUE CROSS/BLUE SHIELD | Source: Ambulatory Visit | Attending: Urology | Admitting: Urology

## 2023-02-23 ENCOUNTER — Other Ambulatory Visit: Payer: Self-pay

## 2023-02-23 ENCOUNTER — Encounter (HOSPITAL_COMMUNITY): Payer: Self-pay

## 2023-02-23 HISTORY — DX: Essential (primary) hypertension: I10

## 2023-02-23 HISTORY — DX: Malignant (primary) neoplasm, unspecified: C80.1

## 2023-02-25 NOTE — Progress Notes (Signed)
Anesthesia Chart Review   Case: [id number] Date/Time: 03/06/23 1345   Procedures:      TRANSURETHRAL RESECTION OF BLADDER TUMOR (TURBT) with GEMCITABINE     CYSTOSCOPY WITH BILATERAL RETROGRADE PYELOGRAM (Bilateral) - 45 MINS FOR CASE   Anesthesia type: General   Pre-op diagnosis: BLADDER TUMOR   Location: [facility] PROC[facility]URE [facility] / [facility]   Surgeons: Crista Elliot, MD       DISCUSSION:62 y.o. never smoker with h/o HTN, bladder tumor scheduled for above procedure 03/06/2023 with Dr. Modena Slater.   Pt seen by cardiology 10/14/2022 for evaluation of chest pain and palpitations.  Echo and Stress test ordered.   Echo 09/26/2022 with EF 67%, mild mitral regurgitation.   Stress test 01/22/2023 with no ischemia.  VS: Ht 5\' 1"  (1.549 m)   LMP 05/09/2015 Comment: Spotting  BMI 31.74 kg/m   PROVIDERS: Wilfrid Lund, PA is PCP   Tessa Lerner, DO is Cardiologist  LABS:  DOS (all labs ordered are listed, but only abnormal results are displayed)  Labs Reviewed - No data to display   IMAGES:   EKG:   CV: Echocardiogram 09/26/2022:  Left ventricle cavity is normal in size and wall thickness. Normal global  wall motion. Normal LV systolic function with EF 67%. Normal diastolic  filling pattern.  Mild (Grade I) mitral regurgitation.  Normal right atrial pressure.   Treadmill Exercise Stress 01/22/2023: The patient exercised for 4 minutes and 19 seconds on Bruce protocol; achieved 6.23 METs at 100% of maximum predicted heart rate. Stress EKG is negative for ischemia.  Chest pain is not present. Stress terminated due to fatigue and THR achieved. Accelerated HR response with reduced exercise capacity.  No arrhythmias noted on ECG at stress.  The blood pressure response was normal. Past Medical History:  Diagnosis Date   Anemia    hx   Anxiety    hx   Arthritis    DJD LOWER BACK   Bladder tumor    Cancer (HCC)    skin cancer on leg   Complication of anesthesia    B/P dropped  with surgery in 2019   Depression    hx   Eczema    GERD (gastroesophageal reflux disease)    occ tums   Heart murmur    "WENT AWAY" NO CARDIOLOGIST   Hyperlipidemia    Hypertension    Incontinence of urine    Tubular adenoma of colon     Past Surgical History:  Procedure Laterality Date   BREAST BIOPSY Right 2017   BENIGN   THYROIDECTOMY Left 03/09/2015   Procedure: LEFT THYROID LOBECTOMY;  Surgeon: Christia Reading, MD;  Location[facility];  Service: ENT;  Laterality: Left;   TRANSURETHRAL RESECTION OF BLADDER TUMOR WITH MITOMYCIN-C N/A 06/13/2019   Procedure: TRANSURETHRAL RESECTION OF BLADDER TUMOR WITH GEMCITABINE;  Surgeon: Crista Elliot, MD;  Location: [facility];  Service: Urology;  Laterality: N/A;   TUBAL LIGATION     UTERUS CLEANED OUT  04/2018   WISDOM TOOTH EXTRACTION      MEDICATIONS:  ALPRAZolam (NIRAVAM) 0.25 MG dissolvable tablet   b complex vitamins capsule   bismuth subsalicylate (PEPTO BISMOL) 262 MG/15ML suspension   cholecalciferol (VITAMIN D3) 25 MCG (1000 UNIT) tablet   COLLAGEN PO   Cyanocobalamin (B-12) 5000 MCG CAPS   escitalopram (LEXAPRO) 10 MG tablet   fluticasone (FLONASE) 50 MCG/ACT nasal spray   Green Tea, Camellia sinensis, (GREEN TEA PO)   ibuprofen (  ADVIL) 200 MG tablet   Multiple Vitamin (MULTIVITAMIN) tablet   olmesartan (BENICAR) 40 MG tablet   omeprazole (PRILOSEC) 40 MG capsule   No current facility-administered medications for this encounter.     Jodell Cipro Ward, PA-C [facility] Pre-Surgical Testing [phone]

## 2023-03-06 ENCOUNTER — Ambulatory Visit (HOSPITAL_COMMUNITY): Payer: BLUE CROSS/BLUE SHIELD | Admitting: Physician Assistant

## 2023-03-06 ENCOUNTER — Ambulatory Visit (HOSPITAL_COMMUNITY): Payer: BLUE CROSS/BLUE SHIELD | Admitting: Anesthesiology

## 2023-03-06 ENCOUNTER — Ambulatory Visit (HOSPITAL_COMMUNITY)
Admission: RE | Admit: 2023-03-06 | Discharge: 2023-03-06 | Disposition: A | Discharge status: Home / Self Care | Payer: BLUE CROSS/BLUE SHIELD | Attending: Urology | Admitting: Urology

## 2023-03-06 ENCOUNTER — Encounter (HOSPITAL_COMMUNITY)
Admission: RE | Disposition: A | Discharge status: Home / Self Care | Payer: Self-pay | Source: Home / Self Care | Attending: Urology

## 2023-03-06 ENCOUNTER — Other Ambulatory Visit: Payer: Self-pay

## 2023-03-06 ENCOUNTER — Encounter (HOSPITAL_COMMUNITY): Payer: Self-pay | Admitting: Urology

## 2023-03-06 ENCOUNTER — Ambulatory Visit (HOSPITAL_COMMUNITY): Payer: BLUE CROSS/BLUE SHIELD

## 2023-03-06 DIAGNOSIS — C679 Malignant neoplasm of bladder, unspecified: Secondary | ICD-10-CM | POA: Diagnosis not present

## 2023-03-06 DIAGNOSIS — K219 Gastro-esophageal reflux disease without esophagitis: Secondary | ICD-10-CM | POA: Insufficient documentation

## 2023-03-06 DIAGNOSIS — N393 Stress incontinence (female) (male): Secondary | ICD-10-CM | POA: Insufficient documentation

## 2023-03-06 DIAGNOSIS — I10 Essential (primary) hypertension: Secondary | ICD-10-CM | POA: Diagnosis not present

## 2023-03-06 DIAGNOSIS — Z79899 Other long term (current) drug therapy: Secondary | ICD-10-CM | POA: Diagnosis not present

## 2023-03-06 DIAGNOSIS — D303 Benign neoplasm of bladder: Secondary | ICD-10-CM | POA: Diagnosis not present

## 2023-03-06 DIAGNOSIS — E785 Hyperlipidemia, unspecified: Secondary | ICD-10-CM | POA: Diagnosis not present

## 2023-03-06 DIAGNOSIS — D494 Neoplasm of unspecified behavior of bladder: Secondary | ICD-10-CM | POA: Diagnosis not present

## 2023-03-06 DIAGNOSIS — Z01818 Encounter for other preprocedural examination: Secondary | ICD-10-CM

## 2023-03-06 HISTORY — PX: CYSTOSCOPY W/ RETROGRADES: SHX1426

## 2023-03-06 LAB — BASIC METABOLIC PANEL
Anion gap: 8 (ref 5–15)
BUN: 11 mg/dL (ref 6–20)
CO2: 23 mmol/L (ref 22–32)
Calcium: 8.5 mg/dL — ABNORMAL LOW (ref 8.9–10.3)
Chloride: 108 mmol/L (ref 98–111)
Creatinine, Ser: 0.69 mg/dL (ref 0.44–1.00)
GFR, Estimated: >60 mL/min (ref >60)
Glucose, Bld: 87 mg/dL (ref 70–99)
Potassium: 3.9 mmol/L (ref 3.5–5.1)
Sodium: 139 mmol/L (ref 135–145)

## 2023-03-06 LAB — CBC
HCT: 36.8 % (ref 36.0–46.0)
Hemoglobin: 13 g/dL (ref 12.0–15.0)
MCH: 30.7 pg (ref 26.0–34.0)
MCHC: 35.3 g/dL (ref 30.0–36.0)
MCV: 86.8 fL (ref 80.0–100.0)
Platelets: 189 10*3/uL (ref 150–400)
RBC: 4.24 MIL/uL (ref 3.87–5.11)
RDW: 12 % (ref 11.5–15.5)
WBC: 5.3 10*3/uL (ref 4.0–10.5)
nRBC: 0 % (ref 0.0–0.2)

## 2023-03-06 SURGERY — TURBT, WITH CHEMOTHERAPEUTIC AGENT INSTILLATION INTO BLADDER
Anesthesia: General | Site: Ureter

## 2023-03-06 MED ORDER — MIDAZOLAM HCL 5 MG/5ML IJ SOLN
INTRAMUSCULAR | Status: DC | PRN
  Administered 2023-03-06: 2 mg via INTRAVENOUS

## 2023-03-06 MED ORDER — FENTANYL CITRATE (PF) 100 MCG/2ML IJ SOLN
INTRAMUSCULAR | Status: AC
  Filled 2023-03-06: qty 2

## 2023-03-06 MED ORDER — ONDANSETRON HCL 4 MG/2ML IJ SOLN: 4.0000 mg | Freq: Once | INTRAMUSCULAR | Status: DC | PRN

## 2023-03-06 MED ORDER — SODIUM CHLORIDE 0.9 % IR SOLN
Status: DC | PRN
  Administered 2023-03-06: 3000 mL

## 2023-03-06 MED ORDER — CHLORHEXIDINE GLUCONATE 0.12 % MT SOLN
15.0000 mL | Freq: Once | OROMUCOSAL | Status: AC
  Administered 2023-03-06: 15 mL via OROMUCOSAL

## 2023-03-06 MED ORDER — CEFAZOLIN SODIUM-DEXTROSE 2-4 GM/100ML-% IV SOLN
INTRAVENOUS | Status: AC
  Filled 2023-03-06: qty 100

## 2023-03-06 MED ORDER — DEXAMETHASONE SODIUM PHOSPHATE 4 MG/ML IJ SOLN
INTRAMUSCULAR | Status: DC | PRN
  Administered 2023-03-06: 5 mg via INTRAVENOUS

## 2023-03-06 MED ORDER — FENTANYL CITRATE (PF) 100 MCG/2ML IJ SOLN
INTRAMUSCULAR | Status: DC | PRN
  Administered 2023-03-06 (×2): 50 ug via INTRAVENOUS

## 2023-03-06 MED ORDER — ONDANSETRON HCL 4 MG/2ML IJ SOLN
INTRAMUSCULAR | Status: DC | PRN
  Administered 2023-03-06: 4 mg via INTRAVENOUS

## 2023-03-06 MED ORDER — AMISULPRIDE (ANTIEMETIC) 5 MG/2ML IV SOLN: 10.0000 mg | Freq: Once | INTRAVENOUS | Status: DC | PRN

## 2023-03-06 MED ORDER — ORAL CARE MOUTH RINSE: 15.0000 mL | Freq: Once | OROMUCOSAL | Status: AC

## 2023-03-06 MED ORDER — LIDOCAINE HCL (PF) 2 % IJ SOLN
INTRAMUSCULAR | Status: AC
  Filled 2023-03-06: qty 5

## 2023-03-06 MED ORDER — ROCURONIUM BROMIDE 10 MG/ML (PF) SYRINGE
PREFILLED_SYRINGE | INTRAVENOUS | Status: DC | PRN
  Administered 2023-03-06: 40 mg via INTRAVENOUS

## 2023-03-06 MED ORDER — LIDOCAINE 2% (20 MG/ML) 5 ML SYRINGE
INTRAMUSCULAR | Status: DC | PRN
  Administered 2023-03-06: 80 mg via INTRAVENOUS

## 2023-03-06 MED ORDER — ACETAMINOPHEN 500 MG PO TABS
ORAL_TABLET | ORAL | Status: AC
  Administered 2023-03-06: 1000 mg via ORAL
  Filled 2023-03-06: qty 2

## 2023-03-06 MED ORDER — PROPOFOL 10 MG/ML IV BOLUS
INTRAVENOUS | Status: DC | PRN
  Administered 2023-03-06: 120 mg via INTRAVENOUS

## 2023-03-06 MED ORDER — GEMCITABINE CHEMO FOR BLADDER INSTILLATION 2000 MG
2000.0000 mg | Freq: Once | INTRAVENOUS | Status: DC
  Administered 2023-03-06: 2000 mg via INTRAVESICAL

## 2023-03-06 MED ORDER — ONDANSETRON HCL 4 MG/2ML IJ SOLN
INTRAMUSCULAR | Status: AC
  Filled 2023-03-06: qty 2

## 2023-03-06 MED ORDER — DEXAMETHASONE SODIUM PHOSPHATE 10 MG/ML IJ SOLN
INTRAMUSCULAR | Status: AC
  Filled 2023-03-06: qty 1

## 2023-03-06 MED ORDER — SUGAMMADEX SODIUM 200 MG/2ML IV SOLN
INTRAVENOUS | Status: DC | PRN
  Administered 2023-03-06: 200 mg via INTRAVENOUS

## 2023-03-06 MED ORDER — LACTATED RINGERS IV SOLN: INTRAVENOUS | Status: DC

## 2023-03-06 MED ORDER — ACETAMINOPHEN 500 MG PO TABS: 1000.0000 mg | ORAL_TABLET | Freq: Once | ORAL | Status: AC

## 2023-03-06 MED ORDER — HYDROCODONE-ACETAMINOPHEN 5-325 MG PO TABS: 1.0000 | ORAL_TABLET | ORAL | 0 refills | Status: AC | PRN

## 2023-03-06 MED ORDER — MIDAZOLAM HCL 2 MG/2ML IJ SOLN
INTRAMUSCULAR | Status: AC
  Filled 2023-03-06: qty 2

## 2023-03-06 MED ORDER — ROCURONIUM BROMIDE 10 MG/ML (PF) SYRINGE
PREFILLED_SYRINGE | INTRAVENOUS | Status: AC
Start: 2023-03-06 — End: ?
  Filled 2023-03-06: qty 10

## 2023-03-06 MED ORDER — IOHEXOL 300 MG/ML  SOLN
INTRAMUSCULAR | Status: DC | PRN
  Administered 2023-03-06: 20 mL

## 2023-03-06 MED ORDER — GEMCITABINE CHEMO FOR BLADDER INSTILLATION 2000 MG
2000.0000 mg | Freq: Once | INTRAVENOUS | Status: DC
  Filled 2023-03-06: qty 52.6

## 2023-03-06 MED ORDER — CEFAZOLIN SODIUM-DEXTROSE 2-4 GM/100ML-% IV SOLN
2.0000 g | INTRAVENOUS | Status: AC
Start: 2023-03-06 — End: 2023-03-06
  Administered 2023-03-06: 2 g via INTRAVENOUS

## 2023-03-06 MED ORDER — FENTANYL CITRATE PF 50 MCG/ML IJ SOSY: 25.0000 ug | PREFILLED_SYRINGE | INTRAMUSCULAR | Status: DC | PRN

## 2023-03-06 SURGICAL SUPPLY — 10 items
BAG URO CATCHER STRL LF (MISCELLANEOUS) ×2 IMPLANT
CATH FOLEY 2WAY SLVR 5CC 16FR (CATHETERS) IMPLANT
CATH URETL OPEN END 6FR 70 (CATHETERS) ×2 IMPLANT
CLOTH BEACON ORANGE TIMEOUT ST (SAFETY) ×2 IMPLANT
GLOVE SURG LX STRL 7.5 STRW (GLOVE) ×2 IMPLANT
GUIDEWIRE STR DUAL SENSOR (WIRE) IMPLANT
KIT TURNOVER KIT A (KITS) IMPLANT
NS IRRIG 1000ML POUR BTL (IV SOLUTION) IMPLANT
PACK CYSTO (CUSTOM PROCEDURE TRAY) ×2 IMPLANT
TUBING CONNECTING 10 (TUBING) ×2 IMPLANT

## 2023-03-06 NOTE — Transfer of Care (Signed)
Immediate Anesthesia Transfer of Care Note  Patient: [name]  Procedure(s) Performed: TRANSURETHRAL RESECTION OF BLADDER TUMOR (TURBT) with GEMCITABINE (Bladder) CYSTOSCOPY WITH BILATERAL RETROGRADE PYELOGRAM (Bilateral: Ureter)  Patient Location: PACU  Anesthesia Type:General  Level of Consciousness: awake and patient cooperative  Airway & Oxygen Therapy: Patient Spontanous Breathing and Patient connected to face mask  Post-op Assessment: Report given to RN and Post -op Vital signs reviewed and stable  Post vital signs: Reviewed and stable  Last Vitals:  Vitals Value Taken Time  BP 156/89 03/06/23 1447  Temp    Pulse 69 03/06/23 1449  Resp 11 03/06/23 1449  SpO2 97 % 03/06/23 1449  Vitals shown include unfiled device data.  Last Pain:  Vitals:   03/06/23 1206  PainSc: 0-No pain         Complications: No notable events documented.

## 2023-03-06 NOTE — Anesthesia Procedure Notes (Signed)
Procedure Name: Intubation Date/Time: 03/06/2023 2:02 PM  Performed by: Vanessa [place], CRNAPre-anesthesia Checklist: Patient identified, Emergency Drugs available, Suction available and Patient being monitored Patient Re-evaluated:Patient Re-evaluated prior to induction Oxygen Delivery Method: Circle system utilized Preoxygenation: Pre-oxygenation with 100% oxygen Induction Type: IV induction Ventilation: Mask ventilation without difficulty Laryngoscope Size: 2 and Miller Grade View: Grade I Tube type: Oral Tube size: 7.0 mm Number of attempts: 1 Airway Equipment and Method: Stylet Placement Confirmation: ETT inserted through vocal cords under direct vision, positive ETCO2 and breath sounds checked- equal and bilateral Secured at: 21 cm Tube secured with: Tape Dental Injury: Teeth and Oropharynx as per pre-operative assessment

## 2023-03-06 NOTE — Op Note (Signed)
Operative Note  Preoperative diagnosis:  1.  Bladder tumor  Postoperative diagnosis: 1.  Bladder tumor--small  Procedure(s): 1.  Transurethral resection of bladder tumor--small 2.  Bilateral retrograde pyelogram 3.  Intravesical instillation of gemcitabine  Surgeon: Modena Slater, MD  Assistants: None  Anesthesia: General  Complications: None immediate  EBL: Minimal  Specimens: 1.  Bladder tumor  Drains/Catheters: 1.  18 French Foley catheter  Intraoperative findings: 1.  Normal urethra 2.  Bilateral duplicated collecting system.  Retrograde pyelogram revealed no evidence of filling defect bilaterally.  Some fullness in the right lower pole moiety but no significant hydronephrosis. 3.  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  She presents for the previously mentioned operation.  Description of procedure:  The patient was identified and consent was obtained.  The patient was taken to the operating room and placed in the supine position.  The patient was placed under general anesthesia.  Perioperative antibiotics were administered.  The patient was placed in dorsal lithotomy.  Patient was prepped and draped in a standard sterile fashion and a timeout was performed.  A 21 French rigid cystoscope was advanced into the urethra and into the bladder.  Complete cystoscopy was performed with findings noted above.  Each ureteral orifice was intubated with an open-ended ureteral catheter and a retrograde pyelogram was performed with findings noted above.  Once this was completed I exchanged for the 26 French resectoscope with a visual obturator in place which was advanced into the bladder.  I exchanged for the bipolar working element and resected the tumor on bipolar  settings followed by fulguration of the resection bed.  There was no evidence of any perforation or active bleeding.  I collected the specimen.  I removed the scope and placed a Foley catheter.  This concluded the operation.  Patient tolerated the procedure well was stable postoperatively.  In the PACU, I instilled gemcitabine into the bladder where it remained for approximately 1 hour prior to proper disposal.  Plan: Follow-up in 1 week for pathology review

## 2023-03-06 NOTE — H&P (Signed)
CC/HPI: CC: Bladder tumor  HPI:  05/31/2019  62 year old female referred by Dr. Sherron Monday after a microscopic hematuria evaluation revealed a small bladder tumor. He performed a cystoscopy that showed a small papillary tumor. He obtained a CT IVP that re-demonstrated small bladder tumors x2. No upper tract abnormalities. She presents to discuss options.   06/20/2019  Patient is status post TURBT. 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 Patient has no complaints.   09/29/2019  Patient presents today for cystoscopy. No interval gross hematuria. She does complain about significant urgency without incontinence as well as significant urinary frequency. She would like to discuss this.   08/02/2021  No interval gross hematuria. Patient presents for cystoscopy. Patient does complain about stress urinary incontinence. Would like to be able to exercise without this. Has some interest in procedural management as well as pelvic floor physical therapy. Denies any urgency with associated urge urinary incontinence.   02/03/2023  Patient returns today for surveillance cystoscopy. No interval hematuria or dysuria.   03/06/2023 Patient presents today for TURBT, b/l RPG, and gemcitabine installation    ALLERGIES: No Allergies    MEDICATIONS: Acid Controller  Multivitamin     GU PSH: Bladder Instill AntiCA Agent - 2021 Cystoscopy - 2023, 2022, 2021, 2020 Cystoscopy TURBT 2-5 cm - 2021 Locm 300-399Mg /Ml Iodine,1Ml - 2021     NON-GU PSH: No Non-GU PSH        GU PMH: Bladder tumor/neoplasm - 2023, - 2022, - 2021 Stress Incontinence - 2023 Urinary Frequency - 2021 Urinary Urgency - 2021 Microscopic hematuria - 2020    NON-GU PMH: COVID-19     FAMILY HISTORY: No Family History     SOCIAL HISTORY: Marital Status: Married     REVIEW OF SYSTEMS:      GU Review Female:  Patient reports frequent urination and leakage of urine. Patient denies hard to postpone  urination, burning /pain with urination, get up at night to urinate, stream starts and stops, trouble starting your stream, have to strain to urinate, and being pregnant.     Gastrointestinal (Upper):  Patient denies nausea, vomiting, and indigestion/ heartburn.     Gastrointestinal (Lower):  Patient denies diarrhea and constipation.     Constitutional:  Patient denies fever, night sweats, weight loss, and fatigue.     Skin:  Patient denies skin rash/ lesion and itching.     Eyes:  Patient denies blurred vision and double vision.     Ears/ Nose/ Throat:  Patient denies sore throat and sinus problems.     Hematologic/Lymphatic:  Patient denies swollen glands and easy bruising.     Cardiovascular:  Patient denies leg swelling and chest pains.     Respiratory:  Patient denies cough and shortness of breath.     Endocrine:  Patient denies excessive thirst.     Musculoskeletal:  Patient denies back pain and joint pain.     Neurological:  Patient denies headaches and dizziness.     Psychologic:  Patient denies depression and anxiety.     VITAL SIGNS: None      MULTI-SYSTEM PHYSICAL EXAMINATION:       Constitutional: Well-nourished. No physical deformities. Normally developed. Good grooming.      Gastrointestinal: No mass, no tenderness, no rigidity, non obese abdomen.      Eyes: Normal conjunctivae. Normal eyelids.      Musculoskeletal: Normal gait and station of head and neck.  ASSESSMENT:     ICD-10 Details  1 GU:  Bladder tumor/neoplasm - D41.4 Chronic, Stable   PLAN:    Notes:  Recommend cystoscopy with bilateral retrograde pyelogram, transurethral resection of bladder tumor with instillation of gemcitabine. Risk and benefits discussed.

## 2023-03-06 NOTE — Anesthesia Postprocedure Evaluation (Signed)
Anesthesia Post Note  Patient: [name]  Procedure(s) Performed: TRANSURETHRAL RESECTION OF BLADDER TUMOR (TURBT) with GEMCITABINE (Bladder) CYSTOSCOPY WITH BILATERAL RETROGRADE PYELOGRAM (Bilateral: Ureter)     Patient location during evaluation: PACU Anesthesia Type: General Level of consciousness: awake and alert Pain management: pain level controlled Vital Signs Assessment: post-procedure vital signs reviewed and stable Respiratory status: spontaneous breathing, nonlabored ventilation, respiratory function stable and patient connected to nasal cannula oxygen Cardiovascular status: blood pressure returned to baseline and stable Postop Assessment: no apparent nausea or vomiting Anesthetic complications: no   No notable events documented.  Last Vitals:  Vitals:   03/06/23 1615 03/06/23 1637  BP: (!) 140/75 138/83  Pulse: (!) 51 (!) 56  Resp: 15 20  Temp:  36.6 C  SpO2: 95% 99%    Last Pain:  Vitals:   03/06/23 1637  PainSc: 0-No pain                 Collene Schlichter

## 2023-03-06 NOTE — Anesthesia Preprocedure Evaluation (Signed)
Anesthesia Evaluation  Patient identified by MRN, date of birth, ID band Patient awake    Reviewed: Allergy & Precautions, NPO status , Patient's Chart, lab work & pertinent test results  Airway Mallampati: II  TM Distance: >3 FB Neck ROM: Full    Dental  (+) Teeth Intact, Dental Advisory Given   Pulmonary neg pulmonary ROS   Pulmonary exam normal breath sounds clear to auscultation       Cardiovascular hypertension, Pt. on medications Normal cardiovascular exam Rhythm:Regular Rate:Normal     Neuro/Psych  PSYCHIATRIC DISORDERS Anxiety Depression    negative neurological ROS     GI/Hepatic Neg liver ROS,GERD  Medicated,,  Endo/Other  Obesity   Renal/GU negative Renal ROS Bladder dysfunction  BLADDER TUMOR    Musculoskeletal  (+) Arthritis ,    Abdominal   Peds  Hematology negative hematology ROS (+)   Anesthesia Other Findings Day of surgery medications reviewed with the patient.  Reproductive/Obstetrics                             Anesthesia Physical Anesthesia Plan  ASA: 3  Anesthesia Plan: General   Post-op Pain Management: Tylenol PO (pre-op)*   Induction: Intravenous  PONV Risk Score and Plan: 3 and Midazolam, Dexamethasone and Ondansetron  Airway Management Planned: Oral ETT  Additional Equipment:   Intra-op Plan:   Post-operative Plan: Extubation in OR  Informed Consent: I have reviewed the patients History and Physical, chart, labs and discussed the procedure including the risks, benefits and alternatives for the proposed anesthesia with the patient or authorized representative who has indicated his/her understanding and acceptance.     Dental advisory given  Plan Discussed with: CRNA  Anesthesia Plan Comments:        Anesthesia Quick Evaluation

## 2023-03-06 NOTE — Discharge Instructions (Signed)
Transurethral Resection of Bladder Tumor (TURBT) or Bladder Biopsy ° ° °Definition: ° Transurethral Resection of the Bladder Tumor is a surgical procedure used to diagnose and remove tumors within the bladder. TURBT is the most common treatment for early stage bladder cancer. ° °General instructions: °   ° Your recent bladder surgery requires very little post hospital care but some definite precautions. ° °Despite the fact that no skin incisions were used, the area around the bladder incisions are raw and covered with scabs to promote healing and prevent bleeding. Certain precautions are needed to insure that the scabs are not disturbed over the next 2-4 weeks while the healing proceeds. ° °Because the raw surface inside your bladder and the irritating effects of urine you may expect frequency of urination and/or urgency (a stronger desire to urinate) and perhaps even getting up at night more often. This will usually resolve or improve slowly over the healing period. You may see some blood in your urine over the first 6 weeks. Do not be alarmed, even if the urine was clear for a while. Get off your feet and drink lots of fluids until clearing occurs. If you start to pass clots or don't improve call us. ° °Diet: ° °You may return to your normal diet immediately. Because of the raw surface of your bladder, alcohol, spicy foods, foods high in acid and drinks with caffeine may cause irritation or frequency and should be used in moderation. To keep your urine flowing freely and avoid constipation, drink plenty of fluids during the day (8-10 glasses). Tip: Avoid cranberry juice because it is very acidic. ° °Activity: ° °Your physical activity doesn't need to be restricted. However, if you are very active, you may see some blood in the urine. We suggest that you reduce your activity under the circumstances until the bleeding has stopped. ° °Bowels: ° °It is important to keep your bowels regular during the postoperative  period. Straining with bowel movements can cause bleeding. A bowel movement every other day is reasonable. Use a mild laxative if needed, such as milk of magnesia 2-3 tablespoons, or 2 Dulcolax tablets. Call if you continue to have problems. If you had been taking narcotics for pain, before, during or after your surgery, you may be constipated. Take a laxative if necessary. ° ° ° °Medication: ° °You should resume your pre-surgery medications unless told not to. In addition you may be given an antibiotic to prevent or treat infection. Antibiotics are not always necessary. All medication should be taken as prescribed until the bottles are finished unless you are having an unusual reaction to one of the drugs. ° ° ° ° ° °

## 2023-03-07 ENCOUNTER — Encounter (HOSPITAL_COMMUNITY): Payer: Self-pay | Admitting: Urology

## 2023-03-09 LAB — SURGICAL PATHOLOGY

## 2023-03-13 DIAGNOSIS — D414 Neoplasm of uncertain behavior of bladder: Secondary | ICD-10-CM | POA: Diagnosis not present

## 2023-03-13 DIAGNOSIS — R300 Dysuria: Secondary | ICD-10-CM | POA: Diagnosis not present

## 2023-03-30 ENCOUNTER — Encounter (INDEPENDENT_AMBULATORY_CARE_PROVIDER_SITE_OTHER): Payer: Self-pay

## 2023-03-30 ENCOUNTER — Ambulatory Visit (INDEPENDENT_AMBULATORY_CARE_PROVIDER_SITE_OTHER): Payer: BLUE CROSS/BLUE SHIELD | Admitting: Otolaryngology

## 2023-03-30 ENCOUNTER — Encounter (INDEPENDENT_AMBULATORY_CARE_PROVIDER_SITE_OTHER): Payer: Self-pay | Admitting: Otolaryngology

## 2023-03-30 ENCOUNTER — Ambulatory Visit (INDEPENDENT_AMBULATORY_CARE_PROVIDER_SITE_OTHER): Payer: BLUE CROSS/BLUE SHIELD | Admitting: Audiology

## 2023-03-30 VITALS — Ht 64.0 in | Wt 178.0 lb

## 2023-03-30 DIAGNOSIS — R42 Dizziness and giddiness: Secondary | ICD-10-CM

## 2023-03-30 DIAGNOSIS — H9041 Sensorineural hearing loss, unilateral, right ear, with unrestricted hearing on the contralateral side: Secondary | ICD-10-CM

## 2023-03-30 DIAGNOSIS — H9311 Tinnitus, right ear: Secondary | ICD-10-CM | POA: Diagnosis not present

## 2023-03-30 DIAGNOSIS — H903 Sensorineural hearing loss, bilateral: Secondary | ICD-10-CM

## 2023-03-30 NOTE — Progress Notes (Unsigned)
Patient ID: [name], female   DOB: 01/19/1961, 62 y.o.   MRN: [id number]  CC: Chronic dizziness, right ear tinnitus  HPI:  [name] is a 62 y.o. female who presents today complaining of chronic dizziness and right ear tinnitus.  The patient has been symptomatic for 6 months.  The patient describes her dizziness as a lightheaded and swimmy headed sensation.  Each episode lasts from hours to days.  The severity and frequency of her dizziness has gradually decreased over the past 2 months.  She also reports persistent right ear tinnitus.  She describes the tinnitus as a high-pitched ringing noise.  She denies any significant otalgia, otorrhea, or spinning vertigo.  She has occasional right-sided headaches.  She has no previous history of migraine headaches.  She was treated with meclizine.  However, she cannot tolerate the use of meclizine due to the side effects.  The patient has a history of hypertension and anxiety.  She is currently on hypertensive and anxiety medications.  Past Medical History:  Diagnosis Date   Anemia    hx   Anxiety    hx   Arthritis    DJD LOWER BACK   Bladder tumor    Cancer (HCC)    skin cancer on leg   Complication of anesthesia    B/P dropped with surgery in 2019   Depression    hx   Eczema    GERD (gastroesophageal reflux disease)    occ tums   Heart murmur    "WENT AWAY" NO CARDIOLOGIST   Hyperlipidemia    Hypertension    Incontinence of urine    Tubular adenoma of colon     Past Surgical History:  Procedure Laterality Date   BREAST BIOPSY Right 2017   BENIGN   CYSTOSCOPY W/ RETROGRADES Bilateral 03/06/2023   Procedure: CYSTOSCOPY WITH BILATERAL RETROGRADE PYELOGRAM;  Surgeon: Crista Elliot, MD;  Location: [facility];  Service: Urology;  Laterality: Bilateral;  45 MINS FOR CASE   THYROIDECTOMY Left 03/09/2015   Procedure: LEFT THYROID LOBECTOMY;  Surgeon: Christia Reading, MD;  Location: [facility] OR;  Service: ENT;  Laterality: Left;   TRANSURETHRAL  RESECTION OF BLADDER TUMOR WITH MITOMYCIN-C N/A 06/13/2019   Procedure: TRANSURETHRAL RESECTION OF BLADDER TUMOR WITH GEMCITABINE;  Surgeon: Crista Elliot, MD;  Location: [facility];  Service: Urology;  Laterality: N/A;   TUBAL LIGATION     UTERUS CLEANED OUT  04/2018   WISDOM TOOTH EXTRACTION      Family History  Problem Relation Age of Onset   Colon cancer Mother    Breast cancer Mother    Diabetes Mother    Cancer Mother        colon cancer and breast cancer   Depression Mother        Manic depression   Heart attack Father    Heart disease Father    Hypertension Father    Stomach cancer Paternal Aunt    Cancer Maternal Grandmother        ovarian cancer   Ovarian cancer Maternal Grandmother    Esophageal cancer Neg Hx    Rectal cancer Neg Hx     Social History:  reports that she has never smoked. She has never used smokeless tobacco. She reports that she does not drink alcohol and does not use drugs.  Allergies:  Allergies  Allergen Reactions   Sertraline Other (See Comments)    fatigue   Venlafaxine Hcl Other (See Comments)  dry mouth, dizziness, drowsiness    Prior to Admission medications   Medication Sig Start Date End Date Taking? Authorizing Provider  ALPRAZolam (NIRAVAM) 0.25 MG dissolvable tablet Take 0.25 mg by mouth at bedtime as needed for anxiety.   Yes [provider]  b complex vitamins capsule Take 1 capsule by mouth daily.   Yes [provider]  bismuth subsalicylate (PEPTO BISMOL) 262 MG/15ML suspension Take 30 mLs by mouth every 6 (six) hours as needed for diarrhea or loose stools or indigestion.   Yes [provider]  cholecalciferol (VITAMIN D3) 25 MCG (1000 UNIT) tablet Take 2,000 Units by mouth daily.   Yes [provider]  COLLAGEN PO Take 1 tablet by mouth daily.   Yes [provider]  Cyanocobalamin (B-12) 5000 MCG CAPS Take 5,000 mcg by mouth daily at 2 PM.   Yes [provider]  escitalopram (LEXAPRO) 10 MG tablet Take 10 mg by mouth daily. 10/05/22  Yes [provider]  fluticasone (FLONASE) 50 MCG/ACT nasal spray Place 1 spray into both nostrils daily as needed for rhinitis.   Yes [provider]  Chilton Si Tea, Camellia sinensis, (GREEN TEA PO) Take 1 tablet by mouth daily.   Yes [provider]  HYDROcodone-acetaminophen (NORCO) 5-325 MG tablet Take 1 tablet by mouth every 4 (four) hours as needed. 03/06/23  Yes Ray Church III, MD  ibuprofen (ADVIL) 200 MG tablet Take 400 mg by mouth every 6 (six) hours as needed for headache.   Yes [provider]  Multiple Vitamin (MULTIVITAMIN) tablet Take 1 tablet by mouth daily. Woman   Yes [provider]  olmesartan (BENICAR) 40 MG tablet Take 40 mg by mouth daily.   Yes [provider]  omeprazole (PRILOSEC) 40 MG capsule Take 40 mg by mouth daily.   Yes [provider]    Height 5\' 4"  (1.626 m), weight 178 lb (80.7 kg), last menstrual period 05/09/2015. Exam: General: Communicates without difficulty, well nourished, no acute distress. Head: Normocephalic, no evidence injury, no tenderness, facial buttresses intact without stepoff. Face/sinus: No tenderness to palpation and percussion. Facial movement is normal and symmetric. Eyes: PERRL, EOMI. No scleral icterus, conjunctivae clear. Neuro: CN II exam reveals vision grossly intact.  No nystagmus at any point of gaze. Ears: Auricles well formed without lesions.  Ear canals are intact without mass or lesion.  No erythema or edema is appreciated.  The TMs are intact without fluid. Nose: External evaluation reveals normal support and skin without lesions.  Dorsum is intact.  Anterior rhinoscopy reveals congested mucosa over anterior aspect of inferior turbinates and intact septum.  No purulence noted. Oral:  Oral cavity and oropharynx are intact, symmetric, without erythema or edema.  Mucosa is moist without  lesions. Neck: Full range of motion without pain.  There is no significant lymphadenopathy.  No masses palpable.  Thyroid bed within normal limits to palpation.  Parotid glands and submandibular glands equal bilaterally without mass.  Trachea is midline. Neuro:  CN 2-12 grossly intact. Vestibular: No nystagmus at any point of gaze. Dix Hallpike negative.  Vestibular: There is no nystagmus with pneumatic pressure on either tympanic membrane or Valsalva. The cerebellar examination is unremarkable.    Her hearing test shows mild asymmetric right ear high-frequency sensory hearing loss.  Assessment: 1.  The patient's ear canals, tympanic membranes, and middle ear spaces are all normal.  Her Dix-Hallpike maneuver is negative. 2.  Mildly asymmetric right ear high-frequency sensorineural hearing  loss. 3.  Her right ear tinnitus is likely a result of the hearing loss. 4.  Recurrent dizziness of unknown etiology. The possible differential diagnoses include transient BPPV, vestibular migraine, Meniere's disease, peripheral vestibular dysfunction, or other central/systemic causes.    Plan: 1.  The physical exam findings and the hearing test results are reviewed with the patient. 2.  The pathophysiology of vestibular dysfunction and dizziness are discussed extensively with the patient. The possible differential diagnoses are reviewed. Questions are invited and answered.   3.  The small possibility of a retrocochlear lesion causing the asymmetric hearing loss is discussed.  The options of conservative observation versus MRI scan are reviewed.  The patient would like to proceed with the MRI scan. 4.  The strategies to cope with tinnitus, including the use of masker, hearing aids, tinnitus retraining therapy, and avoidance of caffeine and alcohol are discussed.  5.  The patient may benefit from undergoing vestibular neurodiagnostic testing to evaluate for possible vestibular dysfunction.  However, the patient wanted  to defer on the vestibular testing for now. 6.  The patient will return for reevaluation after her MRI scan.  Rosamond Andress W Bostyn Bogie 03/30/2023, 4:19 PM

## 2023-03-31 DIAGNOSIS — H9311 Tinnitus, right ear: Secondary | ICD-10-CM | POA: Insufficient documentation

## 2023-03-31 DIAGNOSIS — R42 Dizziness and giddiness: Secondary | ICD-10-CM | POA: Insufficient documentation

## 2023-03-31 DIAGNOSIS — H9041 Sensorineural hearing loss, unilateral, right ear, with unrestricted hearing on the contralateral side: Secondary | ICD-10-CM | POA: Insufficient documentation

## 2023-03-31 NOTE — Progress Notes (Signed)
  [address] [place], [place] [address] [phone]  Audiological Evaluation    Name: [name]     DOB:   01-22-1961      MRN:   [id number]                                                                                     Service Date: 03/31/2023     Accompanied by: none   Patient comes today after Dr. Suszanne Conners, ENT sent a referral for a hearing evaluation due to concerns with tinnitus.   Symptoms Yes Details  Hearing loss  []    Tinnitus  [x]  Right ear constant, left side rarely noticed  Ear pain/ Ear infections  []    Balance problems  [x]  Swimmy feeling  with head pressure and headaches.   Noise exposure  []    Previous ear surgeries  []    Family history  [x]  Reports that her father had hearing loss after a car accident.  Amplification  []    Other  []      Otoscopy: Right ear: Clear external ear canals and notable landmarks visualized on the tympanic membrane. Left ear:  Clear external ear canals and notable landmarks visualized on the tympanic membrane.  Tympanometry: Right ear: Type A- Normal external ear canal volume with normal middle ear pressure and tympanic membrane compliance Left ear: Type A- Normal external ear canal volume with normal middle ear pressure and tympanic membrane compliance    Pure tone Audiometry: Right ear- Normal hearing from [phone] Hz, then mild to moderate presumably sensorineural hearing loss from 6000-8000 Hz . Left ear-  Normal hearing from [phone] Hz, then a mild presumably   sensorineural hearing loss at 8000 Hz.  The hearing test results were completed under headphones and re-checked with inserts and results are deemed to be of good reliability. Test technique:  conventional     Speech Audiometry: Right ear- Speech Reception Threshold (SRT) was obtained at 10 dBHL Left ear-Speech Reception Threshold (SRT) was obtained at 10 dBHL   Word Recognition Score Tested using NU-6 (MLV) Right ear: 96% was obtained at a  presentation level of 65 dBHL with contralateral masking which is deemed as  excellent Left ear: 96% was obtained at a presentation level of 65 dBHL with contralateral masking which is deemed as  excellent    Impression: There is a significant difference in pure-tone thresholds between ears., There is not a significant difference in the word recognition score in between ears.    Recommendations: Follow up with ENT as scheduled for today. Return for a hearing evaluation or if concerns with hearing changes arise or per MD recommendation. Consider various tinnitus strategies, including the use of a sound generator.  Aayush Gelpi MARIE LEROUX-MARTINEZ, AUD

## 2023-04-01 NOTE — Addendum Note (Signed)
Addended byNewman Pies on: 04/01/2023 08:58 AM   Modules accepted: Orders

## 2023-04-15 DIAGNOSIS — D259 Leiomyoma of uterus, unspecified: Secondary | ICD-10-CM | POA: Diagnosis not present

## 2023-04-25 ENCOUNTER — Ambulatory Visit (HOSPITAL_COMMUNITY)
Admission: RE | Admit: 2023-04-25 | Discharge: 2023-04-25 | Disposition: A | Discharge status: Home / Self Care | Payer: BLUE CROSS/BLUE SHIELD | Source: Ambulatory Visit | Attending: Otolaryngology | Admitting: Otolaryngology

## 2023-04-25 DIAGNOSIS — H9041 Sensorineural hearing loss, unilateral, right ear, with unrestricted hearing on the contralateral side: Secondary | ICD-10-CM | POA: Diagnosis not present

## 2023-04-25 DIAGNOSIS — H905 Unspecified sensorineural hearing loss: Secondary | ICD-10-CM | POA: Diagnosis not present

## 2023-04-25 MED ORDER — GADOBUTROL 1 MMOL/ML IV SOLN
8.0000 mL | Freq: Once | INTRAVENOUS | Status: AC | PRN
  Administered 2023-04-25: 8 mL via INTRAVENOUS

## 2023-05-11 DIAGNOSIS — D485 Neoplasm of uncertain behavior of skin: Secondary | ICD-10-CM | POA: Diagnosis not present

## 2023-05-11 DIAGNOSIS — L853 Xerosis cutis: Secondary | ICD-10-CM | POA: Diagnosis not present

## 2023-05-11 DIAGNOSIS — L570 Actinic keratosis: Secondary | ICD-10-CM | POA: Diagnosis not present

## 2023-05-11 DIAGNOSIS — L573 Poikiloderma of Civatte: Secondary | ICD-10-CM | POA: Diagnosis not present

## 2023-05-11 DIAGNOSIS — Z1283 Encounter for screening for malignant neoplasm of skin: Secondary | ICD-10-CM | POA: Diagnosis not present

## 2023-05-11 DIAGNOSIS — L568 Other specified acute skin changes due to ultraviolet radiation: Secondary | ICD-10-CM | POA: Diagnosis not present

## 2023-05-19 ENCOUNTER — Ambulatory Visit (INDEPENDENT_AMBULATORY_CARE_PROVIDER_SITE_OTHER): Payer: Commercial Managed Care - PPO

## 2023-05-19 ENCOUNTER — Encounter (INDEPENDENT_AMBULATORY_CARE_PROVIDER_SITE_OTHER): Payer: Self-pay | Admitting: Otolaryngology

## 2023-05-19 VITALS — BP 129/85 | HR 68 | Ht 64.0 in | Wt 178.0 lb

## 2023-05-19 DIAGNOSIS — R42 Dizziness and giddiness: Secondary | ICD-10-CM | POA: Diagnosis not present

## 2023-05-19 DIAGNOSIS — H9311 Tinnitus, right ear: Secondary | ICD-10-CM | POA: Diagnosis not present

## 2023-05-19 DIAGNOSIS — H9041 Sensorineural hearing loss, unilateral, right ear, with unrestricted hearing on the contralateral side: Secondary | ICD-10-CM | POA: Diagnosis not present

## 2023-05-20 NOTE — Progress Notes (Signed)
 Patient ID: [name], female   DOB: 1960/05/13, 63 y.o.   MRN: [id number]  Follow-up: Chronic dizziness, right ear tinnitus  HPI: The patient is a 63 year old female who returns today for her follow-up evaluation.  She was last seen in November 2024.  The patient was complaining of recurrent dizziness and right ear tinnitus.  She described her dizziness as a lightheaded and swimmy headed sensation.  She also complains of persistent right ear tinnitus.  Her ear canals, tympanic membranes, and middle ear spaces were normal.  Her Dix-Hallpike maneuver was negative.  She was noted to have mildly asymmetric right ear high-frequency sensorineural hearing loss.  She subsequently underwent an MRI scan.  No retrocochlear lesion was noted.  The patient returns today complaining of persistent right ear tinnitus.  She still has occasional off-balance and lightheaded sensation.  Currently she denies any otalgia, otorrhea, or change in her hearing.  She denies any spinning vertigo.  Exam: General: Communicates without difficulty, well nourished, no acute distress. Head: Normocephalic, no evidence injury, no tenderness, facial buttresses intact without stepoff. Face/sinus: No tenderness to palpation and percussion. Facial movement is normal and symmetric. Eyes: PERRL, EOMI. No scleral icterus, conjunctivae clear. Neuro: CN II exam reveals vision grossly intact.  No nystagmus at any point of gaze. Ears: Auricles well formed without lesions.  Ear canals are intact without mass or lesion.  No erythema or edema is appreciated.  The TMs are intact without fluid. Nose: External evaluation reveals normal support and skin without lesions.  Dorsum is intact.  Anterior rhinoscopy reveals congested mucosa over anterior aspect of inferior turbinates and intact septum.  No purulence noted. Oral:  Oral cavity and oropharynx are intact, symmetric, without erythema or edema.  Mucosa is moist without lesions. Neck: Full range of motion  without pain.  There is no significant lymphadenopathy.  No masses palpable.  Thyroid  bed within normal limits to palpation.  Parotid glands and submandibular glands equal bilaterally without mass.  Trachea is midline. Neuro:  CN 2-12 grossly intact. Vestibular: No nystagmus at any point of gaze. Dix Hallpike negative.  Vestibular: There is no nystagmus with pneumatic pressure on either tympanic membrane or Valsalva. The cerebellar examination is unremarkable.   Assessment: 1.  Mildly asymmetric right ear high-frequency sensorineural hearing loss.  Her MRI scan was negative for retrocochlear lesion. 2.  Her right ear tinnitus is likely a direct result of the hearing loss. 3.  Recurrent dizziness of unknown etiology. The possible differential diagnoses include transient BPPV, vestibular migraine, Meniere's disease, peripheral vestibular dysfunction, or other central/systemic causes.   4.  Her ear canals, tympanic membranes, and middle ear spaces are normal.  Her Dix-Hallpike maneuver is negative.  Plan: 1.  The physical exam findings and the MRI results are reviewed with the patient. 2.  The strategies to cope with tinnitus, including the use of masker, hearing aids, tinnitus retraining therapy, and avoidance of caffeine and alcohol are again discussed.  3.  The pathophysiology of vestibular dysfunction and dizziness are discussed extensively with the patient. The possible differential diagnoses are reviewed.  4.  The patient will likely benefit from undergoing physical therapy/vestibular rehabilitation to improve the balancing function. A referral will be arranged as soon as possible.  5.  If the patient continues to be symptomatic, she may benefit from vestibular neurodiagnostic testing at a tertiary care center to evaluate for possible vestibular dysfunction.

## 2023-06-12 ENCOUNTER — Telehealth: Payer: Self-pay | Admitting: Cardiology

## 2023-06-12 ENCOUNTER — Ambulatory Visit: Payer: BLUE CROSS/BLUE SHIELD | Attending: Cardiology

## 2023-06-12 DIAGNOSIS — R002 Palpitations: Secondary | ICD-10-CM

## 2023-06-12 NOTE — Telephone Encounter (Signed)
She is had a monitor in April 2024. Are the symptoms now the same as before or different? If different arrange a 7 day monitor and based on the result will discuss management at the upcoming visit.   Also d/w PCP the non cardiac causes of palpitations.   Aliyah Abeyta Laporte, DO, [facility]

## 2023-06-12 NOTE — Progress Notes (Unsigned)
 Enrolled patient for a 7 day Zio XT monitor to be mailed to patients home.

## 2023-06-12 NOTE — Telephone Encounter (Signed)
Pt c/o BP issue: STAT if pt c/o blurred vision, one-sided weakness or slurred speech  1. What are your last 5 BP readings? 174/114  2. Are you having any other symptoms (ex. Dizziness, headache, blurred vision, passed out)? no  3. What is your BP issue? Pt is concerned with BP being elevated

## 2023-06-12 NOTE — Telephone Encounter (Signed)
Pt reports HTN Discussed and advised to contact PCP to further discuss as Dr. Emelda Brothers last OV note reflects that PCP is following BPs.  Pt agreeable. Pt reports increased palpitations, occurring more frequently. Aware will forward to Dr. Emelda Brothers team to address.  Aware he may want a monitor before determining if sooner appt needed.  Pt agreeable.

## 2023-06-12 NOTE — Telephone Encounter (Signed)
Spoke with pt and she stated she feels she is having more frequent palpitations even while at rest. Zio monitor for 7days has been ordered and the pt is aware per Dr. Emelda Brothers recommendations.

## 2023-07-31 ENCOUNTER — Ambulatory Visit: Payer: Commercial Managed Care - PPO | Admitting: Cardiology

## 2023-08-05 ENCOUNTER — Telehealth: Payer: Self-pay | Admitting: Cardiology

## 2023-08-05 NOTE — Telephone Encounter (Signed)
 Pt c/o BP issue: STAT if pt c/o blurred vision, one-sided weakness or slurred speech.  STAT if BP is GREATER than 180/120 TODAY.  STAT if BP is LESS than 90/60 and SYMPTOMATIC TODAY  1. What is your BP concern? BP spikes often due to stress  2. Have you taken any BP medication today?                                                                                                                                                      yes   3. What are your last 5 BP readings? 133/95; 76; 113/80; 65; 131/88; 71 117/75; 73; 119/91; 66; 113/74; 63  4. Are you having any other symptoms (ex. Dizziness, headache, blurred vision, passed out)? Anxiety

## 2023-08-05 NOTE — Telephone Encounter (Signed)
 Spoke with patient and she states she has been having spikes in her BP while at work and her heart start racing.  She states she is under a lot of stress and have anxiety. Informed patient both stress and anxiety can increase her BP and anxiety will also increase her heart rate. Also informed patient the BP she reported are not elevated. Advised to contact PCP regarding her stress and anxiety.  She states her PCP decided it will be best if you handled her BP.  Will forward to provider.

## 2023-08-06 NOTE — Telephone Encounter (Signed)
 These are the documented BP  133/95; 76; 113/80; 65; 131/88; 71 117/75; 73; 119/91; 66; 113/74; 63.  Relatively stable given her increased stress.  Please have her follow up w/ PCP for management of anxiety and hypertension.  Low salt diet, reduction in stress, keep a home BP log for PCP to review.   Jeriko Kowalke Larke, DO, [facility]

## 2023-08-06 NOTE — Telephone Encounter (Signed)
 Spoke with pt and explained Dr. Emelda Brothers recommendations. Pt stated she doesn't feel like she has been under any stress lately. Pt was concerned because her BP will spike to 180s at times and then come back down quickly. Pt stated that she went to her PCP today and they had increased her Lexapro to help with her anxiety. Pt advised to for the next week keep a BP log of BP readings before she takes her Olmesartan in the morning, 1-2 hours after taking her Olmesartan, and then in the evening- pt advised to send this BP information to PCP to manage.   Pt also asked if she really needed to do the 7 day zio that was ordered back in January because she had been putting it off. Because pt had mentioned that she felt like her HR would get fast randomly at times, pt advised to complete the heart monitor. Pt verbalized understanding of all information discussed over the phone and denied any further questions at this time.

## 2023-08-06 NOTE — Telephone Encounter (Signed)
 Patient returned RN's call and stated best time to reach her is after 2:30 pm.  Patient stated can leave voice message or text.

## 2023-08-06 NOTE — Telephone Encounter (Signed)
 Attempted to call and speak to pt but unable to reach pt over the phone. LMTCB to discuss Dr. Emelda Brothers recommendations.

## 2023-08-11 ENCOUNTER — Telehealth (INDEPENDENT_AMBULATORY_CARE_PROVIDER_SITE_OTHER): Payer: Self-pay | Admitting: Otolaryngology

## 2023-08-11 ENCOUNTER — Other Ambulatory Visit (INDEPENDENT_AMBULATORY_CARE_PROVIDER_SITE_OTHER): Payer: Self-pay | Admitting: Otolaryngology

## 2023-08-11 DIAGNOSIS — R42 Dizziness and giddiness: Secondary | ICD-10-CM

## 2023-08-11 NOTE — Telephone Encounter (Signed)
 Patient called today and had multiple complains including light headed, slight nausea, jaw pain and panic attacks. Per Dr. Suszanne Conners the next step is vestibular rehabilitation. Order was sent to [facility] neuro rehab.

## 2023-08-28 ENCOUNTER — Ambulatory Visit: Admitting: Cardiology

## 2023-10-26 ENCOUNTER — Other Ambulatory Visit: Payer: Self-pay | Admitting: Family Medicine

## 2023-10-26 DIAGNOSIS — Z1231 Encounter for screening mammogram for malignant neoplasm of breast: Secondary | ICD-10-CM

## 2023-11-27 ENCOUNTER — Ambulatory Visit
Admission: RE | Admit: 2023-11-27 | Discharge: 2023-11-27 | Disposition: A | Discharge status: Home / Self Care | Source: Ambulatory Visit | Attending: Family Medicine | Admitting: Family Medicine

## 2023-11-27 DIAGNOSIS — Z1231 Encounter for screening mammogram for malignant neoplasm of breast: Secondary | ICD-10-CM

## 2023-12-28 ENCOUNTER — Ambulatory Visit: Payer: Self-pay

## 2023-12-28 NOTE — Telephone Encounter (Signed)
Triage completed in another encounter.

## 2023-12-28 NOTE — Telephone Encounter (Signed)
 1st attempt, lvmtcb. Returning dropped call on warm transfer.   Copied from CRM #[id number]. Topic: Clinical - Red Word Triage >> Dec 28, 2023 10:19 AM Gustabo D wrote: Having low blood pressure 84/62 past week, today 99/64 , 130/96 this morning.  Due to her blood pressure medicine.

## 2023-12-28 NOTE — Telephone Encounter (Signed)
 Copied from CRM #[id number]. Topic: Clinical - Red Word Triage >> Dec 28, 2023 10:19 AM Gustabo D wrote: Having low blood pressure 84/62 past week, today 99/64 , 130/96 this morning.  Due to her blood pressure medicine. Reason for Disposition  [1] Systolic BP 90-110 AND [2] taking blood pressure medications AND [3] NOT feeling weak or lightheaded  Answer Assessment - Initial Assessment Questions Pt has hx of anxiety attacks and causing bp to rise. Pt's recent readings have been 99/64; 84/62; 130/96. Pt was placed on xanax  to help prevent attack. Pt is wanting new PCP. RN advised to go to UC or follow up with current PCP until new appt on 10/6     1. BLOOD PRESSURE: What is your blood pressure? Did you take at least two measurements 5 minutes apart?     130/96 2. ONSET: When did you take your blood pressure?     This morning 3. HOW: How did you take your blood pressure? (e.g., visiting nurse, automatic home BP monitor)     Automatic  4. HISTORY: Do you have a history of low blood pressure? What is your blood pressure normally?     Yes before having hypertension 5. MEDICINES: Are you taking any medicines for blood pressure? If Yes, ask: Have they been changed recently?     Olmesartan  6. PULSE RATE: Do you know what your pulse rate is?      Not sure 7. OTHER SYMPTOMS: Have you been sick recently? Have you had a recent injury? Ringing in ear  Protocols used: Blood Pressure - Low-A-AH

## 2024-02-15 ENCOUNTER — Ambulatory Visit: Admitting: General Practice

## 2024-02-15 ENCOUNTER — Ambulatory Visit: Admitting: Cardiology

## 2024-06-01 ENCOUNTER — Ambulatory Visit: Attending: Cardiology | Admitting: Cardiology

## 2024-06-01 ENCOUNTER — Encounter: Payer: Self-pay | Admitting: Cardiology

## 2024-06-01 VITALS — BP 148/92 | HR 74 | Ht 64.0 in | Wt 191.8 lb

## 2024-06-01 DIAGNOSIS — R002 Palpitations: Secondary | ICD-10-CM

## 2024-06-01 DIAGNOSIS — R079 Chest pain, unspecified: Secondary | ICD-10-CM

## 2024-06-01 DIAGNOSIS — I10 Essential (primary) hypertension: Secondary | ICD-10-CM

## 2024-06-01 NOTE — Patient Instructions (Signed)
 Medication Instructions:  Your physician recommends that you continue on your current medications as directed. Please refer to the Current Medication list given to you today.  *If you need a refill on your cardiac medications before your next appointment, please call your pharmacy*  Lab Work: None  If you have labs (blood work) drawn today and your tests are completely normal, you will receive your results only by: MyChart Message (if you have MyChart) OR A paper copy in the mail If you have any lab test that is abnormal or we need to change your treatment, we will call you to review the results.  Testing/Procedures: None   Follow-Up: At [facility], you and your health needs are our priority.  As part of our continuing mission to provide you with exceptional heart care, our providers are all part of one team.  This team includes your primary Cardiologist (physician) and Advanced Practice Providers or APPs (Physician Assistants and Nurse Practitioners) who all work together to provide you with the care you need, when you need it.  Your next appointment:   2 - 3 month(s)  Provider:   Madonna Large, DO     Other Instructions If you experience chest pain go to the ER.

## 2024-06-01 NOTE — Progress Notes (Signed)
 " Cardiology Office Note:  .   Date:  06/01/2024  ID:  [name], DOB 1960/07/02, MRN [id number] PCP: Alben Therisa MATSU, PA  [facility] Providers Cardiologist:  Madonna Large, DO {  History of Present Illness: .   [name] is a 64 y.o. female with history of hypertension, hyperlipidemia, vitamin D  deficiency, anxiety.     Palpitations 09/2022 heart monitor unremarkable.  Sinus rhythm.  Ventricular ectopy less than 1%.  Rare brief episode of PSVT.  Triggered events did not correlate to any arrhythmias.  Chest discomfort 11/2022 CAC score 3.66. 01/2023 treadmill stress test without any abnormalities.  Terminated due to fatigue and THR achieved.  Social history  No family history of CAD.  Not a smoker, not diabetic.  No drugs or alcohol.     Patient without any prior significant cardiac history.  Seen for palpitations in the past without any findings on cardiac monitor.  Reported precordial chest discomfort, sporadic, lasting for few minutes, nonexertional, self-limiting.  CAC score 3.66.  Last seen 10/2022 and palpitations were improved.  No significant changes.  Today patient presents for complaints of chest discomfort.  She reports similar symptoms as described in HPI.  She describes squeezing-like chest pain that is nonexertional, lasting few minutes at a time, self-limiting, with no associated or radiating symptoms.  It is hard for her to describe when they happen because she is unclear of when or how often they occur because it so sporadically.  Reported episode today and also reports increase in blood pressure so she was worried.  Reports previously had blood pressure that was related to anxiety and once anxiety was treated she came off blood pressure medications but now back on it.  Reports starting olmesartan by her PCP yesterday.  ROS: Denies: shortness of breath, orthopnea, peripheral edema, syncope, decreased exercise capacity, fatigue, dizziness.   Studies Reviewed: SABRA     EKG Interpretation Date/Time:  Wednesday June 01 2024 13:26:33 EST Ventricular Rate:  74 PR Interval:  168 QRS Duration:  92 QT Interval:  418 QTC Calculation: 463 R Axis:   66  Text Interpretation: Normal sinus rhythm Normal ECG When compared with ECG of 21-Jun-2022 14:20, No significant change was found Confirmed by Darryle Currier [phone]) on 06/01/2024 1:28:59 PM    Risk Assessment/Calculations:        Physical Exam:   VS:  BP (!) 148/92   Pulse 74   Ht 5' 4 (1.626 m)   Wt 191 lb 12.8 oz (87 kg)   LMP 05/09/2015 Comment: Spotting  SpO2 97%   BMI 32.92 kg/m    Wt Readings from Last 3 Encounters:  06/01/24 191 lb 12.8 oz (87 kg)  05/19/23 178 lb (80.7 kg)  03/30/23 178 lb (80.7 kg)    GEN: Well nourished, well developed in no acute distress NECK: No JVD; No carotid bruits CARDIAC: RRR, no murmurs, rubs, gallops RESPIRATORY:  Clear to auscultation without rales, wheezing or rhonchi  ABDOMEN: Soft, non-tender, non-distended EXTREMITIES:  No edema; No deformity   ASSESSMENT AND PLAN: .    Chest discomfort -11/2022 CAC score 3.66 Again reports precordial chest pain that is sporadic, lasting few minutes at a time, nonexertional and self-limiting.  Prior CAC scoring demonstrating minimal CAC.  Risk factors include hypertension, hyperlipidemia.  EKG with no acute ST-T wave changes.  Overall seems to be relatively low risk with reassuring workup including normal treadmill stress test, echocardiogram, heart monitor, and with noncardiac sounding chest pain.  Suspect symptoms  may be related to elevated blood pressure/anxiety.   I think for now we can monitor her symptoms and we can see her back in clinic in a couple months to see if she has continued chest pain despite treatment of her blood pressure. ER precautions discussed.  Palpitations -09/2022 heart monitor unremarkable.  Sinus rhythm.  Ventricular ectopy less than 1%.  Rare brief episode of PSVT.  Triggered events did not  correlate to any arrhythmias. Still reports some episodes but heart monitor not correlating with any cardiac related palpitations.  Chronic dizziness, right ear tinnitus  Follows ENT.  Hypertension PCP managing this.  Recently started on olmesartan 40 mg daily.   Dispo: Follow-up with Dr. Michele in 2 to 3 months to discuss chest pain.  If she has persistent symptoms may be at that time repeat more detailed ischemic workup with coronary CTA.  Signed, Thom LITTIE Sluder, PA-C  "

## 2024-08-08 ENCOUNTER — Ambulatory Visit: Admitting: Cardiology
